# Patient Record
Sex: Male | Born: 1975 | Race: Black or African American | Hispanic: No | Marital: Single | Smoking: Current every day smoker
Health system: Southern US, Community
[De-identification: ages and names within clinical notes are randomized; demographics above are authoritative.]

## PROBLEM LIST (undated history)

## (undated) DIAGNOSIS — I10 Essential (primary) hypertension: Secondary | ICD-10-CM

---

## 2014-07-18 ENCOUNTER — Encounter (HOSPITAL_COMMUNITY): Payer: Self-pay | Admitting: Emergency Medicine

## 2014-07-18 ENCOUNTER — Emergency Department (HOSPITAL_COMMUNITY): Payer: Self-pay

## 2014-07-18 ENCOUNTER — Emergency Department (HOSPITAL_COMMUNITY)
Admission: EM | Admit: 2014-07-18 | Discharge: 2014-07-18 | Disposition: A | Discharge status: Home / Self Care | Payer: Self-pay | Attending: Emergency Medicine | Admitting: Emergency Medicine

## 2014-07-18 DIAGNOSIS — S62308A Unspecified fracture of other metacarpal bone, initial encounter for closed fracture: Secondary | ICD-10-CM

## 2014-07-18 DIAGNOSIS — Y9389 Activity, other specified: Secondary | ICD-10-CM | POA: Insufficient documentation

## 2014-07-18 DIAGNOSIS — Y998 Other external cause status: Secondary | ICD-10-CM | POA: Insufficient documentation

## 2014-07-18 DIAGNOSIS — Y9289 Other specified places as the place of occurrence of the external cause: Secondary | ICD-10-CM | POA: Insufficient documentation

## 2014-07-18 DIAGNOSIS — Z720 Tobacco use: Secondary | ICD-10-CM | POA: Insufficient documentation

## 2014-07-18 DIAGNOSIS — I10 Essential (primary) hypertension: Secondary | ICD-10-CM | POA: Insufficient documentation

## 2014-07-18 DIAGNOSIS — S62326A Displaced fracture of shaft of fifth metacarpal bone, right hand, initial encounter for closed fracture: Secondary | ICD-10-CM | POA: Insufficient documentation

## 2014-07-18 DIAGNOSIS — W2201XA Walked into wall, initial encounter: Secondary | ICD-10-CM | POA: Insufficient documentation

## 2014-07-18 HISTORY — DX: Essential (primary) hypertension: I10

## 2014-07-18 MED ORDER — OXYCODONE-ACETAMINOPHEN 5-325 MG PO TABS
1.0000 | ORAL_TABLET | Freq: Once | ORAL | Status: AC
  Administered 2014-07-18: 1 via ORAL
  Filled 2014-07-18: qty 1

## 2014-07-18 MED ORDER — OXYCODONE-ACETAMINOPHEN 5-325 MG PO TABS: 1.0000 | ORAL_TABLET | ORAL | Status: DC | PRN

## 2014-07-18 MED ORDER — ONDANSETRON 8 MG PO TBDP
8.0000 mg | ORAL_TABLET | Freq: Once | ORAL | Status: AC
  Administered 2014-07-18: 8 mg via ORAL
  Filled 2014-07-18: qty 1

## 2014-07-18 MED ORDER — NAPROXEN 250 MG PO TABS: 250.0000 mg | ORAL_TABLET | Freq: Two times a day (BID) | ORAL | Status: DC

## 2014-07-18 NOTE — ED Notes (Signed)
Pt's family walked out to desk and reported that the Pt has begun feeling nauseous.   Primary RN made aware.

## 2014-07-18 NOTE — ED Provider Notes (Signed)
CSN: [id number]     Arrival date & time 07/18/14  1548 History  This chart was scribed for non-physician practitioner, Matthew FarrierWilliam Trenton Verne, PA-C working with Matthew Munchobert Lockwood, MD, by Matthew Alvarado, ED Scribe. This patient was seen in room WTR5/WTR5 and the patient's care was started at 6:20 PM.     Chief Complaint  Patient presents with  . Hand Pain    The history is provided by the patient. No language interpreter was used.    HPI Comments: Matthew Alvarado is a 39 y.o. male with a h/o HTN who presents to the Emergency Department complaining of constant, mild, right hand pain for 1 day. He is having associated swelling to his right hand. Pt notes that he had numbness and tingling in his right fingertips yesterday but denies those symptoms today. He states he injured his hand while punching a wall yesterday. He denies any previous injuries to his hand. Patient reports that his pain was 10 out of 10 earlier but he was given a Percocet in the ER now he has no pain.  He denies any weakness, fever, or chills. He denies current numbness or tingling.    Past Medical History  Diagnosis Date  . Hypertension    History reviewed. No pertinent past surgical history. No family history on file. History  Substance Use Topics  . Smoking status: Current Some Day Smoker  . Smokeless tobacco: Not on file  . Alcohol Use: No    Review of Systems  Constitutional: Negative for fever and chills.  Musculoskeletal: Positive for joint swelling and arthralgias.  Skin: Negative for wound.  Neurological: Negative for weakness and numbness.      Allergies  Vicodin  Home Medications   Prior to Admission medications   Medication Sig Start Date End Date Taking? Authorizing Provider  naproxen (NAPROSYN) 250 MG tablet Take 1 tablet (250 mg total) by mouth 2 (two) times daily with a meal. 07/18/14   Matthew FarrierWilliam Rocklin Soderquist, PA-C  oxyCODONE-acetaminophen (PERCOCET/ROXICET) 5-325 MG per tablet Take 1 tablet by mouth every 4  (four) hours as needed for severe pain. For breakthrough pain 07/18/14   Matthew FarrierWilliam Matthew Spychalski, PA-C   Triage Vitals: BP 136/82 mmHg  Pulse 64  Temp(Src) 98.8 F (37.1 C) (Oral)  Resp 20  SpO2 100%  Physical Exam  Constitutional: He appears well-developed and well-nourished. No distress.  HENT:  Head: Normocephalic and atraumatic.  Eyes: Right eye exhibits no discharge. Left eye exhibits no discharge.  Cardiovascular: Intact distal pulses.   Bilateral radial pulses are intact. Good capillary refill in his distal digits.  Pulmonary/Chest: Effort normal. No respiratory distress.  Musculoskeletal: Normal range of motion. He exhibits edema and tenderness.       Right shoulder: He exhibits no tenderness.       Right elbow: No tenderness found.   bilateral radial pulses intact. Sensation intact bilaterally. Edema overlying dorsal aspect of right hand. No obvious deformity noted. Able to move all digits without difficulty. No right shoulder, wrist or elbow tenderness.  Neurological: He is alert. Coordination normal.  Skin: No rash noted. He is not diaphoretic.  Psychiatric: He has a normal mood and affect. His behavior is normal.  Nursing note and vitals reviewed.   ED Course  Procedures (including critical care time)  DIAGNOSTIC STUDIES: Oxygen Saturation is 100% on RA, normal by my interpretation.    COORDINATION OF CARE: 6:25 PM- Will order Gutter ulnar splint and discharge pt with Percocet.  Pt advised of plan for treatment and  pt agrees.    Labs Review Labs Reviewed - No data to display  Imaging Review Dg Hand Complete Right  07/18/2014   CLINICAL DATA:  The patient punched a wall 07/17/2014 with onset of right hand pain and swelling. Initial encounter.  EXAM: RIGHT HAND - COMPLETE 3+ VIEW  COMPARISON:  None.  FINDINGS: Soft tissue swelling is seen about the fifth metacarpal. There is a fracture through the proximal metaphysis of the fifth metacarpal which appears nondisplaced. No  other acute bony or joint abnormality is identified.  IMPRESSION: Acute Proximal metaphyseal fracture of the fifth metacarpal appears nondisplaced.   Electronically Signed   By: Matthew Alvarado M.D.   On: 07/18/2014 16:24     EKG Interpretation None      Filed Vitals:   07/18/14 1602  BP: 136/82  Pulse: 64  Temp: 98.8 F (37.1 C)  TempSrc: Oral  Resp: 20  SpO2: 100%     MDM   Meds given in ED:  Medications  oxyCODONE-acetaminophen (PERCOCET/ROXICET) 5-325 MG per tablet 1 tablet (1 tablet Oral Given 07/18/14 1737)  ondansetron (ZOFRAN-ODT) disintegrating tablet 8 mg (8 mg Oral Given 07/18/14 1737)    Discharge Medication List as of 07/18/2014  6:32 PM    START taking these medications   Details  naproxen (NAPROSYN) 250 MG tablet Take 1 tablet (250 mg total) by mouth 2 (two) times daily with a meal., Starting 07/18/2014, Until Discontinued, Print    oxyCODONE-acetaminophen (PERCOCET/ROXICET) 5-325 MG per tablet Take 1 tablet by mouth every 4 (four) hours as needed for severe pain. For breakthrough pain, Starting 07/18/2014, Until Discontinued, Print        Final diagnoses:  Closed fracture of 5th metacarpal, initial encounter   This is a 39 year old male presents to the emergency department complaining of right hand pain after punching a wall yesterday. The patient has edema overlying the dorsal aspect of his right hand without obvious deformity. Sensation is intact. He has good bilateral radial pulses. He has normal capillary refill. X-rays indicate a proximal non-displaced metaphyseal fracture of the fifth metacarpal. Will place an ulnar gutter splint and have him follow up with hand surgeon Matthew Alvarado. I advised him to call Matthew Alvarado office tomorrow to make an appointment for follow-up.  I advised the patient to follow-up with their primary care provider this week. I advised the patient to return to the emergency department with new or worsening symptoms or new concerns. The  patient verbalized understanding and agreement with plan.   I personally performed the services described in this documentation, which was scribed in my presence. The recorded information has been reviewed and is accurate.      Matthew Farrier, PA-C 07/18/14 1928  Matthew Munch, MD 07/19/14 [id number]

## 2014-07-18 NOTE — ED Notes (Signed)
Pt c/o pain and swelling in R hand after punching a wall yesterday. Pt has swelling to R lateral hand and sts it hurts to move his pinky finger. Pt denise numbness and tingling. A&Ox4 and ambulatory.

## 2014-07-18 NOTE — Discharge Instructions (Signed)
Fracture of the proximal aspect of your 5th metacarpal.  You have a break (fracture) of the fifth metacarpal bone. This is commonly called a boxer's fracture. This is the bone in the hand where the little finger attaches. The fracture is in the end of that bone, closest to the little finger. It is usually caused when you hit an object with a clenched fist. Often, the knuckle is pushed down by the impact. Sometimes, the fracture rotates out of position. A boxer's fracture will usually heal within 6 weeks, if it is treated properly and protected from re-injury. Surgery is sometimes needed. A cast, splint, or bulky hand dressing may be used to protect and immobilize a boxer's fracture. Do not remove this device or dressing until your caregiver approves. Keep your hand elevated, and apply ice packs for 15-20 minutes every 2 hours, for the first 2 days. Elevation and ice help reduce swelling and relieve pain. See your caregiver, or an orthopedic specialist, for follow-up care within the next 10 days. This is to make sure your fracture is healing properly. Document Released: 03/22/2005 Document Revised: 06/14/2011 Document Reviewed: 09/09/2006 [facility] Patient Information 2015 [place], [place]. This information is not intended to replace advice given to you by your health care provider. Make sure you discuss any questions you have with your health care provider.  Cast or Splint Care Casts and splints support injured limbs and keep bones from moving while they heal. It is important to care for your cast or splint at home.  HOME CARE INSTRUCTIONS  Keep the cast or splint uncovered during the drying period. It can take 24 to 48 hours to dry if it is made of plaster. A fiberglass cast will dry in less than 1 hour.  Do not rest the cast on anything harder than a pillow for the first 24 hours.  Do not put weight on your injured limb or apply pressure to the cast until your health care provider gives you  permission.  Keep the cast or splint dry. Wet casts or splints can lose their shape and may not support the limb as well. A wet cast that has lost its shape can also create harmful pressure on your skin when it dries. Also, wet skin can become infected.  Cover the cast or splint with a plastic bag when bathing or when out in the rain or snow. If the cast is on the trunk of the body, take sponge baths until the cast is removed.  If your cast does become wet, dry it with a towel or a blow dryer on the cool setting only.  Keep your cast or splint clean. Soiled casts may be wiped with a moistened cloth.  Do not place any hard or soft foreign objects under your cast or splint, such as cotton, toilet paper, lotion, or powder.  Do not try to scratch the skin under the cast with any object. The object could get stuck inside the cast. Also, scratching could lead to an infection. If itching is a problem, use a blow dryer on a cool setting to relieve discomfort.  Do not trim or cut your cast or remove padding from inside of it.  Exercise all joints next to the injury that are not immobilized by the cast or splint. For example, if you have a long leg cast, exercise the hip joint and toes. If you have an arm cast or splint, exercise the shoulder, elbow, thumb, and fingers.  Elevate your injured arm or  leg on 1 or 2 pillows for the first 1 to 3 days to decrease swelling and pain.It is best if you can comfortably elevate your cast so it is higher than your heart. SEEK MEDICAL CARE IF:   Your cast or splint cracks.  Your cast or splint is too tight or too loose.  You have unbearable itching inside the cast.  Your cast becomes wet or develops a soft spot or area.  You have a bad smell coming from inside your cast.  You get an object stuck under your cast.  Your skin around the cast becomes red or raw.  You have new pain or worsening pain after the cast has been applied. SEEK IMMEDIATE MEDICAL  CARE IF:   You have fluid leaking through the cast.  You are unable to move your fingers or toes.  You have discolored (blue or white), cool, painful, or very swollen fingers or toes beyond the cast.  You have tingling or numbness around the injured area.  You have severe pain or pressure under the cast.  You have any difficulty with your breathing or have shortness of breath.  You have chest pain. Document Released: 03/19/2000 Document Revised: 01/10/2013 Document Reviewed: 09/28/2012 [facility] Patient Information 2015 [place], [place]. This information is not intended to replace advice given to you by your health care provider. Make sure you discuss any questions you have with your health care provider.

## 2014-09-06 DIAGNOSIS — F1010 Alcohol abuse, uncomplicated: Secondary | ICD-10-CM | POA: Insufficient documentation

## 2014-09-06 DIAGNOSIS — F1410 Cocaine abuse, uncomplicated: Secondary | ICD-10-CM | POA: Insufficient documentation

## 2014-09-06 DIAGNOSIS — F1110 Opioid abuse, uncomplicated: Secondary | ICD-10-CM | POA: Insufficient documentation

## 2014-09-06 DIAGNOSIS — Z720 Tobacco use: Secondary | ICD-10-CM | POA: Insufficient documentation

## 2014-09-06 DIAGNOSIS — I10 Essential (primary) hypertension: Secondary | ICD-10-CM | POA: Insufficient documentation

## 2014-09-07 ENCOUNTER — Emergency Department (HOSPITAL_COMMUNITY)
Admission: EM | Admit: 2014-09-07 | Discharge: 2014-09-07 | Disposition: A | Discharge status: Home / Self Care | Payer: Self-pay | Attending: Emergency Medicine | Admitting: Emergency Medicine

## 2014-09-07 ENCOUNTER — Encounter (HOSPITAL_COMMUNITY): Payer: Self-pay | Admitting: Emergency Medicine

## 2014-09-07 DIAGNOSIS — F1910 Other psychoactive substance abuse, uncomplicated: Secondary | ICD-10-CM

## 2014-09-07 MED ORDER — LOPERAMIDE HCL 2 MG PO CAPS: ORAL_CAPSULE | ORAL | Status: DC

## 2014-09-07 MED ORDER — METHOCARBAMOL 500 MG PO TABS: 500.0000 mg | ORAL_TABLET | Freq: Three times a day (TID) | ORAL | Status: DC | PRN

## 2014-09-07 MED ORDER — DICYCLOMINE HCL 20 MG PO TABS: 20.0000 mg | ORAL_TABLET | Freq: Four times a day (QID) | ORAL | Status: AC | PRN

## 2014-09-07 MED ORDER — NAPROXEN 500 MG PO TABS: 500.0000 mg | ORAL_TABLET | Freq: Two times a day (BID) | ORAL | Status: AC

## 2014-09-07 MED ORDER — HYDROXYZINE HCL 25 MG PO TABS: 25.0000 mg | ORAL_TABLET | Freq: Four times a day (QID) | ORAL | Status: AC | PRN

## 2014-09-07 NOTE — ED Notes (Addendum)
Pt from home requesting detox from alcohol and drugs. He reports last use of cocaine. Heroin, and alcohol was at 1900. He denies SI or HI. He reports he is depressed "but I don't want to hurt myself or anybody".

## 2014-09-07 NOTE — Discharge Instructions (Signed)
Polysubstance Abuse When people abuse more than one drug or type of drug it is called polysubstance or polydrug abuse. For example, many smokers also drink alcohol. This is one form of polydrug abuse. Polydrug abuse also refers to the use of a drug to counteract an unpleasant effect produced by another drug. It may also be used to help with withdrawal from another drug. People who take stimulants may become agitated. Sometimes this agitation is countered with a tranquilizer. This helps protect against the unpleasant side effects. Polydrug abuse also refers to the use of different drugs at the same time.  Anytime drug use is interfering with normal living activities, it has become abuse. This includes problems with family and friends. Psychological dependence has developed when your mind tells you that the drug is needed. This is usually followed by physical dependence which has developed when continuing increases of drug are required to get the same feeling or "high". This is known as addiction or chemical dependency. A person's risk is much higher if there is a history of chemical dependency in the family. SIGNS OF CHEMICAL DEPENDENCY  You have been told by friends or family that drugs have become a problem.  You fight when using drugs.  You are having blackouts (not remembering what you do while using).  You feel sick from using drugs but continue using.  You lie about use or amounts of drugs (chemicals) used.  You need chemicals to get you going.  You are suffering in work performance or in school because of drug use.  You get sick from use of drugs but continue to use anyway.  You need drugs to relate to people or feel comfortable in social situations.  You use drugs to forget problems. "Yes" answered to any of the above signs of chemical dependency indicates there are problems. The longer the use of drugs continues, the greater the problems will become. If there is a family history of  drug or alcohol use, it is best not to experiment with these drugs. Continual use leads to tolerance. After tolerance develops more of the drug is needed to get the same feeling. This is followed by addiction. With addiction, drugs become the most important part of life. It becomes more important to take drugs than participate in the other usual activities of life. This includes relating to friends and family. Addiction is followed by dependency. Dependency is a condition where drugs are now needed not just to get high, but to feel normal. Addiction cannot be cured but it can be stopped. This often requires outside help and the care of professionals. Treatment centers are listed in the yellow pages under: Cocaine, Narcotics, and Alcoholics Anonymous. Most hospitals and clinics can refer you to a specialized care center. Talk to your caregiver if you need help. Document Released: 11/11/2004 Document Revised: 06/14/2011 Document Reviewed: 03/22/2005 [facility] Patient Information 2015 [place], [place]. This information is not intended to replace advice given to you by your health care provider. Make sure you discuss any questions you have with your health care provider.  Behavioral Health Resources in the [facility]  Intensive Outpatient Programs: [facility]      [address] [place], [place] [phone] Both a day and evening program       [facility]     [address]        [place], [place] [address] [phone]  [facility] [address] [place] [phone]  [facility] Mental Health ACCESS LINE: [phone] or [phone] [address] [place], [place] [address] [email or web address]  Mobile Crisis Teams:                                        [facility]         Mobile Crisis Care Unit [phone]             [facility] [address] Ginette Otto [phone]                                         [facility] [address] [place] [phone]  Self-Help/Support Groups: Mental Health [facility]oc[facility] of [employer] of support groups [phone] (call for more info)  Narcotics Anonymous (NA) [facility] [address] [place] - 2 meetings at this location  Residential Treatment Programs:  [facility]      [address]        [place]       [phone]         [facility] [address], [place] [address] [place], [place]  [address] [phone]  [facility]  [address] [place], [place] [address] [phone] Admissions: 8am-3pm M-F  [facility]     [address]        [place], [place] [address]       [phone]         [facility] [address] [place], [place] [phone]  [facility] [address] [place], [place] [phone]  [facility] - Intensive Outpatient      [address]     [place], [place]       [phone]         [facility]     [address] [place], [place] [phone] or [phone]  [facility] ([facility])  [address] [place], [place] [phone]  [facility] [address]                                               [address] [place] [phone]  [facility] Resources: [phone]               General Therapy                                                Angie Fava, PhD        [address] [place], [place] [address]         [phone]   Insurance  [facility]   [address] [place], [place] [address]  [phone]  [facility] [address] [place], [place] [address] [phone] Insurance/Medicaid/sponsorship  through [facility]                                              [address]                                        [place], [place] [address]    Therapy/tele-psych/case         [phone]          [facility] [address], [place]  [address]  Adolescent/group home/case management [phone]                                           Creola Corn PhD       General therapy       Insurance   [phone]         Dr. Lolly Mustache Insurance [phone] M-F  [facility], sponsorship [phone]

## 2014-09-07 NOTE — ED Provider Notes (Signed)
CSN: [id number]     Arrival date & time 09/06/14  2303 History   First MD Initiated Contact with Patient 09/07/14 [phone]     Chief Complaint  Patient presents with  . detox      (Consider location/radiation/quality/duration/timing/severity/associated sxs/prior Treatment) HPI [age] year old male presents to the emergency department from home with complaint of polysubstance abuse, requesting detox.  Patient reports that he abuses alcohol, cocaine, heroin.  Patient reports he drinks every few days, usually vodka.  He does not get shaky or have withdrawal symptoms if he goes a day or 2 without drinking.  Patient reports that he is recently escalating with his heroin use, now is using almost daily.  He reports he uses cocaine frequently as well.  Patient denies any significant withdrawal symptoms if he goes a day without heroin.  He does report some diarrhea.  Patient reports some depression.  He denies any suicidal ideation.  Last use this evening. Past Medical History  Diagnosis Date  . Hypertension    History reviewed. No pertinent past surgical history. No family history on file. History  Substance Use Topics  . Smoking status: Current Some Day Smoker  . Smokeless tobacco: Not on file  . Alcohol Use: No    Review of Systems  Psychiatric/Behavioral: Negative for suicidal ideas and self-injury.  All other systems reviewed and are negative.     Allergies  Vicodin  Home Medications   Prior to Admission medications   Medication Sig Start Date End Date Taking? Authorizing Provider  ibuprofen (ADVIL,MOTRIN) 200 MG tablet Take 400 mg by mouth every 6 (six) hours as needed for moderate pain.   Yes Historical Provider, MD  dicyclomine (BENTYL) 20 MG tablet Take 1 tablet (20 mg total) by mouth every 6 (six) hours as needed for spasms (for abdominal cramping). 09/07/14   Marisa Severinlga Chandan Fly, MD  hydrOXYzine (ATARAX/VISTARIL) 25 MG tablet Take 1 tablet (25 mg total) by mouth every 6 (six) hours as needed  for anxiety or nausea. 09/07/14   Marisa Severinlga Nazly Digilio, MD  loperamide (IMODIUM) 2 MG capsule Take 1-2 capsules (2-4 mg total) by mouth as needed for diarrhea 09/07/14   Marisa Severinlga Alvine Mostafa, MD  methocarbamol (ROBAXIN) 500 MG tablet Take 1 tablet (500 mg total) by mouth every 8 (eight) hours as needed for muscle spasms. 09/07/14   Marisa Severinlga Lillianne Eick, MD  naproxen (NAPROSYN) 500 MG tablet Take 1 tablet (500 mg total) by mouth 2 (two) times daily with a meal. 09/07/14   Marisa Severinlga Nehemiah Montee, MD   BP 136/88 mmHg  Pulse 68  Temp(Src) 98 F (36.7 C) (Oral)  Resp 19  SpO2 100% Physical Exam  Constitutional: He is oriented to person, place, and time. He appears well-developed and well-nourished.  HENT:  Head: Normocephalic and atraumatic.  Nose: Nose normal.  Mouth/Throat: Oropharynx is clear and moist.  Eyes: Conjunctivae and EOM are normal. Pupils are equal, round, and reactive to light.  Neck: Normal range of motion. Neck supple. No JVD present. No tracheal deviation present. No thyromegaly present.  Cardiovascular: Normal rate, regular rhythm, normal heart sounds and intact distal pulses.  Exam reveals no gallop and no friction rub.   No murmur heard. Pulmonary/Chest: Effort normal and breath sounds normal. No stridor. No respiratory distress. He has no wheezes. He has no rales. He exhibits no tenderness.  Abdominal: Soft. Bowel sounds are normal. He exhibits no distension and no mass. There is no tenderness. There is no rebound and no guarding.  Musculoskeletal: Normal range of motion.  He exhibits no edema or tenderness.  Lymphadenopathy:    He has no cervical adenopathy.  Neurological: He is alert and oriented to person, place, and time. He displays normal reflexes. He exhibits normal muscle tone. Coordination normal.  Skin: Skin is warm and dry. No rash noted. No erythema. No pallor.  Psychiatric: He has a normal mood and affect. His behavior is normal. Judgment and thought content normal.  Nursing note and vitals reviewed.   ED  Course  Procedures (including critical care time) Labs Review Labs Reviewed - No data to display  Imaging Review No results found.   EKG Interpretation None      MDM   Final diagnoses:  Polysubstance abuse    39 year old male with polysubstance abuse.  Patient by his own history, does not appear to have any withdrawal symptoms at this time or at any specific kind if he goes long without abusing heroin or alcohol.  Patient is requesting inpatient treatment, he reports this is because if he goes back to where he is living.  He will abuse drugs again.  I have explained to the patient that we no longer provide inpatient treatment for alcohol or opiate abuse.  I have offered him medications to help with withdrawal symptoms, and outpatient resources.  Patient told the nurse briefly that he was starting to feel suicidal, wanted talk to him about this, he denies that he has a plan for killing himself, and reports that he wouldn't try to commit suicide.  He is only depressed about having to go back into the environment that caused him to abuse drugs in the first place.  Patient reports that he will use the outpatient resources given.    Marisa Severin, MD 09/07/14 [phone]

## 2014-10-10 ENCOUNTER — Emergency Department (HOSPITAL_COMMUNITY): Payer: Self-pay

## 2014-10-10 ENCOUNTER — Emergency Department (HOSPITAL_COMMUNITY)
Admission: EM | Admit: 2014-10-10 | Discharge: 2014-10-10 | Disposition: A | Discharge status: Home / Self Care | Payer: Self-pay | Attending: Emergency Medicine | Admitting: Emergency Medicine

## 2014-10-10 ENCOUNTER — Encounter (HOSPITAL_COMMUNITY): Payer: Self-pay

## 2014-10-10 DIAGNOSIS — I10 Essential (primary) hypertension: Secondary | ICD-10-CM | POA: Insufficient documentation

## 2014-10-10 DIAGNOSIS — Y9289 Other specified places as the place of occurrence of the external cause: Secondary | ICD-10-CM | POA: Insufficient documentation

## 2014-10-10 DIAGNOSIS — R42 Dizziness and giddiness: Secondary | ICD-10-CM | POA: Insufficient documentation

## 2014-10-10 DIAGNOSIS — S52122A Displaced fracture of head of left radius, initial encounter for closed fracture: Secondary | ICD-10-CM | POA: Insufficient documentation

## 2014-10-10 DIAGNOSIS — W11XXXA Fall on and from ladder, initial encounter: Secondary | ICD-10-CM | POA: Insufficient documentation

## 2014-10-10 DIAGNOSIS — Y9339 Activity, other involving climbing, rappelling and jumping off: Secondary | ICD-10-CM | POA: Insufficient documentation

## 2014-10-10 DIAGNOSIS — Y990 Civilian activity done for income or pay: Secondary | ICD-10-CM | POA: Insufficient documentation

## 2014-10-10 DIAGNOSIS — W19XXXA Unspecified fall, initial encounter: Secondary | ICD-10-CM

## 2014-10-10 DIAGNOSIS — Z720 Tobacco use: Secondary | ICD-10-CM | POA: Insufficient documentation

## 2014-10-10 MED ORDER — OXYCODONE-ACETAMINOPHEN 5-325 MG PO TABS: 1.0000 | ORAL_TABLET | Freq: Three times a day (TID) | ORAL | Status: DC | PRN

## 2014-10-10 MED ORDER — OXYCODONE-ACETAMINOPHEN 5-325 MG PO TABS
1.0000 | ORAL_TABLET | Freq: Once | ORAL | Status: AC
  Administered 2014-10-10: 1 via ORAL
  Filled 2014-10-10: qty 1

## 2014-10-10 NOTE — ED Notes (Signed)
Pt presents with c/o left elbow injury after a fall that occurred earlier today. Pt reports he caught himself on that left arm when he fell.

## 2014-10-10 NOTE — ED Provider Notes (Signed)
CSN: [id number]     Arrival date & time 10/10/14  1747 History  This chart was scribed for Matthew Helper, PA-C, working with Arby Barrette, MD by Chestine Spore, ED Scribe. The patient was seen in room WTR5/WTR5 at 7:01 PM.        Chief Complaint  Patient presents with  . Arm Injury     The history is provided by the patient. No language interpreter was used.    HPI Comments: Matthew Alvarado is a 39 y.o. male with a medical hx of HTN who presents to the Emergency Department complaining of a left arm injury onset today 2:00 PM. Pt reports that he fell from 3 feet while trying to climb on a 4 feet ladder while working and caught himself on his left arm when he fell. Pt is right hand dominant, pt rates his left arm pain as 10/10. He states that he is having associated symptoms of left arm pain and lightheaded. He denies LOC, and any other symptoms. Pt is allergic to vicodin.   Past Medical History  Diagnosis Date  . Hypertension    History reviewed. No pertinent past surgical history. No family history on file. History  Substance Use Topics  . Smoking status: Current Some Day Smoker  . Smokeless tobacco: Not on file  . Alcohol Use: No    Review of Systems  Musculoskeletal: Positive for arthralgias. Negative for joint swelling.  Skin: Negative for color change and wound.  Neurological: Positive for light-headedness. Negative for syncope.      Allergies  Vicodin  Home Medications   Prior to Admission medications   Medication Sig Start Date End Date Taking? Authorizing Provider  ibuprofen (ADVIL,MOTRIN) 200 MG tablet Take 400 mg by mouth every 6 (six) hours as needed for moderate pain.   Yes Historical Provider, MD  dicyclomine (BENTYL) 20 MG tablet Take 1 tablet (20 mg total) by mouth every 6 (six) hours as needed for spasms (for abdominal cramping). Patient not taking: Reported on 10/10/2014 09/07/14   Marisa Severin, MD  hydrOXYzine (ATARAX/VISTARIL) 25 MG tablet Take 1 tablet (25 mg  total) by mouth every 6 (six) hours as needed for anxiety or nausea. Patient not taking: Reported on 10/10/2014 09/07/14   Marisa Severin, MD  loperamide (IMODIUM) 2 MG capsule Take 1-2 capsules (2-4 mg total) by mouth as needed for diarrhea Patient not taking: Reported on 10/10/2014 09/07/14   Marisa Severin, MD  methocarbamol (ROBAXIN) 500 MG tablet Take 1 tablet (500 mg total) by mouth every 8 (eight) hours as needed for muscle spasms. Patient not taking: Reported on 10/10/2014 09/07/14   Marisa Severin, MD  naproxen (NAPROSYN) 500 MG tablet Take 1 tablet (500 mg total) by mouth 2 (two) times daily with a meal. Patient not taking: Reported on 10/10/2014 09/07/14   Marisa Severin, MD   BP 139/80 mmHg  Pulse 84  Temp(Src) 99 F (37.2 C) (Oral)  Resp 20  SpO2 98% Physical Exam  Constitutional: He is oriented to person, place, and time. He appears well-developed and well-nourished. No distress.  HENT:  Head: Normocephalic and atraumatic.  Eyes: EOM are normal.  Neck: Neck supple. No tracheal deviation present.  Cardiovascular: Normal rate.   Pulmonary/Chest: Effort normal. No respiratory distress.  Musculoskeletal:       Left elbow: He exhibits decreased range of motion (secondary to pain) and swelling. Tenderness found.  Left elbow tenderness noted to posterior elbow at the posterior epicondyle with mild edema. Decreased flexion and extension  secondary to pain. No crepitus.  L elbow with FROM and nontender L wrist with FROM and nontender L hand without tenderness, no pain at anatomical snuff box.  Radial pulse intact in L wrist.   Neurological: He is alert and oriented to person, place, and time.  Skin: Skin is warm and dry.  Psychiatric: He has a normal mood and affect. His behavior is normal.  Nursing note and vitals reviewed.   ED Course  Procedures (including critical care time) DIAGNOSTIC STUDIES: Oxygen Saturation is 98% on RA, nl by my interpretation.    COORDINATION OF CARE: 7:05 PM-patient here  with mechanical injury to left elbow. He is neurovascularly intact. Discussed treatment plan which includes left elbow x-ray, percocet with pt at bedside and pt agreed to plan.   7:46 PM X-ray of left elbow shows a large elbow joint effusion which raises suspicion for a poorly characterized radial head fracture. I suspect this is indeed a radial head fracture, closed. Plan to apply a posterior long arm with sling. Patient discharged with pain medication and close follow-up with an specialist further care. Care discussed with Dr. Donnald GarrePfeiffer.   Labs Review Labs Reviewed - No data to display  Imaging Review Dg Elbow Complete Left  10/10/2014   CLINICAL DATA:  Status post fall off ladder; left posterior elbow pain. Initial encounter.  EXAM: LEFT ELBOW - COMPLETE 3+ VIEW  COMPARISON:  None.  FINDINGS: A large elbow joint effusion is noted. This raises suspicion for a poorly characterized radial head fracture. No additional fractures are seen. The soft tissues are otherwise grossly unremarkable.  IMPRESSION: Large elbow joint effusion raises suspicion for a poorly characterized radial head fracture.   Electronically Signed   By: Roanna RaiderJeffery  Chang M.D.   On: 10/10/2014 19:29     EKG Interpretation None      MDM   Final diagnoses:  Fall  Left radial head fracture, closed, initial encounter    BP 139/80 mmHg  Pulse 84  Temp(Src) 99 F (37.2 C) (Oral)  Resp 20  SpO2 98%  I have reviewed nursing notes and vital signs. I personally viewed the imaging tests through PACS system and agrees with radiologist's intepretation I reviewed available ER/hospitalization records through the EMR   I personally performed the services described in this documentation, which was scribed in my presence. The recorded information has been reviewed and is accurate.     Matthew HelperBowie Eligah Anello, PA-C 10/10/14 1947  Arby BarretteMarcy Pfeiffer, MD 10/14/14 [phone]

## 2014-10-10 NOTE — Discharge Instructions (Signed)
Follow up with [name] next week for further management of your left elbow injury.  Follow instruction below.   Elbow Fracture, Radial Head with Rehab The radial head is the end of the forearm bone on the thumb side of the arm (radius). It is part of the elbow. The radial head is vulnerable to both complete and incomplete fractures. SYMPTOMS   Severe elbow and arm pain, at the time of injury.  Tenderness, inflammation, and later bruising (contusion) of the elbow (within 48 hours).  Visible deformity, if the fracture is complete, and the bone fragments are not aligned properly (displaced).  Numbness, coldness, or paralysis in the elbow, forearm, or hand from pressure on the blood vessels or nerves (uncommon). CAUSES  An elbow fracture occurs when a force is placed on the bone that is greater than it can handle. Typical causes of injury include:  Indirect trauma, such as falling on an outstretched hand (most common cause).  Direct hit (trauma) to the elbow.  Twisting injury to the elbow. RISK INCREASES WITH:  Contact sports (football, rugby).  Sports in which falling is likely (skating, basketball).  Children under 51 years of age and adults over 33.  Bone or joint disease (osteoarthritis, bone tumor).  Poor strength and flexibility. PREVENTION   Warm up and stretch properly before activity.  Maintain physical fitness:  Strength, flexibility, and endurance.  Cardiovascular fitness.  When appropriate, wear properly fitted and padded elbow protection. PROGNOSIS  If treated properly, elbow fractures often heal within 6 to 8 weeks for adults, and 4 to 6 weeks in children.  RELATED COMPLICATIONS   Fracture does not heal (nonunion).  Fracture heals in improper alignment (malunion).  Chronic pain, stiffness, loss of motion, or swelling of the elbow.  Excessive bleeding in the elbow or at the fracture site, causing pressure and injury to nerves and blood vessels  (uncommon).  Calcification of the soft tissues around the elbow (heterotopic ossification).  Risk of bone death, due to interrupted blood supply caused by the fracture.  Unstable or arthritic joint, following repeated injury.  Stopping of normal bone growth in children.  Wasting away (atrophy), weakness, stiffness, numbness, and poor control of the hand, due to injury to blood vessels, nerves, cartilage, muscle, ligaments, and connective tissue sheets (fascia). TREATMENT  If the fracture is displaced, it must be put back in proper alignment (reduced) by an individual trained in the procedure. Often, displaced fractures cannot be realigned by hand, and surgery is needed. Once the bones are aligned (with or without surgery), ice and medicine can be used to reduce pain and inflammation. The elbow should be restrained for at least 1 to 2 weeks. After restraint, it is important to complete strengthening and stretching exercises, to regain strength and a full range of motion. Theses exercises may be completed at home or with a therapist.  MEDICATION   If pain medicine is needed, nonsteroidal anti-inflammatory medicines (aspirin and ibuprofen), or other minor pain relievers (acetaminophen), are often advised.  Do not take pain medicine for 7 days before surgery.  Prescription pain relievers may be given, if your caregiver thinks they are needed. Use only as directed and only as much as you need. COLD THERAPY  Cold treatment (icing) should be applied for 10 to 15 minutes every 2 to 3 hours for inflammation and pain, and immediately after activity that aggravates your symptoms. Use ice packs or an ice massage. SEEK MEDICAL CARE IF:  Pain, tenderness, or swelling gets worse,  despite treatment.  You experience pain, numbness, or coldness in the hand.  Blue, gray, or dark color appears in the fingernails.  Any of the following occur after surgery: fever, increased pain, swelling, redness, drainage  of fluids, or bleeding in the affected area.  New, unexplained symptoms develop. (Drugs used in treatment may produce side effects.) EXERCISES  RANGE OF MOTION (ROM) AND STRETCHING EXERCISES - Elbow Fracture (Radial Head) These exercises may help you restore your elbow mobility once your physician has discontinued your restraint period. Beginning these before your caregiver's approval may result in delayed healing. Your symptoms may go away with or without further involvement from your physician, physical therapist or athletic trainer. While completing these exercises, remember:   Restoring tissue flexibility helps normal motion to return to the joints. This allows healthier, less painful movement and activity.  An effective stretch should be held for at least 30 seconds. A stretch should never be painful. You should only feel a gentle lengthening or release in the stretch. RANGE OF MOTION - Supination, Active-Assisted  Sit with your right / left elbow bent at 90 degrees, resting your forearm on a table.  Keeping your upper body and shoulder in place, roll your forearm so your palm faces upward. When you can go no farther, use your opposite hand to help, until you feel a gentle to moderate stretch. Hold for __________ seconds.  Slowly release the stretch and return to the starting position. Repeat __________ times. Complete this exercise __________ times per day. RANGE OF MOTION - Pronation, Active-Assisted   Sit with your right / left elbow bent at 90 degrees, resting your forearm on a table.  Keeping your upper body and shoulder in place, roll your forearm so your palm faces the tabletop. When you can go no farther, use your opposite hand to help, until you feel a gentle to moderate stretch. Hold for __________ seconds.  Slowly release the stretch and return to the starting position. Repeat __________ times. Complete this exercise __________ times per day. RANGE OF MOTION -  Extension  Hold your right / left arm at your side and straighten your elbow as far as you can, using your right / left arm muscles.  Straighten the right / left elbow farther by gently pushing down on your forearm, until you feel a gentle stretch on the inside of your elbow. Hold this position for __________ seconds.  Slowly return to the starting position. Repeat __________ times. Complete this exercise __________ times per day.  RANGE OF MOTION - Flexion  Hold your right / left arm at your side and bend your elbow as far as you can, using your right / left arm muscles.  Bend the right / left elbow farther by gently pushing up on your forearm, until you feel a gentle stretch on the outside of your elbow. Hold this position for __________ seconds.  Slowly return to the starting position. Repeat __________ times. Complete this exercise __________ times per day.  RANGE OF MOTION - Supination, Active  Stand or sit with your elbows at your side. Bend your right / left elbow to 90 degrees.  Turn your palm upward until you feel a gentle stretch on the inside of your forearm.  Hold this position for __________ seconds. Slowly release and return to the starting position. Repeat __________ times. Complete this stretch __________ times per day.  RANGE OF MOTION - Pronation, Active  Stand or sit with your elbows at your side. Bend your right /  left elbow to 90 degrees.  Turn your palm downward until you feel a gentle stretch on the top of your forearm.  Hold this position for __________ seconds. Slowly release and return to the starting position. Repeat __________ times. Complete this stretch __________ times per day.  RANGE OF MOTION - Elbow Flexion, Supine  Lie on your back. Extend your right / left arm into the air, bracing it with your opposite hand. Allow your right / left arm to relax.  Let your elbow bend, allowing your hand to fall slowly toward your chest.  You should feel a  gentle stretch along the back of your upper arm and elbow. Your physician, physical therapist or athletic trainer may ask you to hold a __________ hand weight, to increase the intensity of this stretch.  Hold for __________ seconds. Slowly return your right / left arm to the upright position. Repeat __________ times. Complete this exercise __________ times per day. STRETCH - Elbow flexors   Lie on a firm bed or countertop, on your back. Be sure that you are in a comfortable position which will allow you to relax your arm muscles.  Place a folded towel under your right / left upper arm, so that your elbow and shoulder are at the same height. Extend your arm; your elbow should not rest on the bed or towel.  Allow the weight of your hand to straighten your elbow. Keep your arm and chest muscles relaxed. Your caregiver may ask you to increase the intensity of your stretch by adding a small wrist or hand weight.  Hold for __________ seconds. You should feel a stretch on the inside of your elbow. Slowly return to the starting position. Repeat __________ times. Complete this exercise __________ times per day. STRENGTHENING EXERCISES - Elbow Fracture (Radial Head) These exercises may help you restore your elbow mobility once your physician has discontinued your restraint period. Beginning these before your caregiver's approval may result in delayed healing. Your symptoms may go away with or without further involvement from your physician, physical therapist or athletic trainer. While completing these exercises, remember:   Restoring tissue flexibility helps normal motion to return to the joints. This allows healthier, less painful movement and activity.  An effective stretch should be held for at least 30 seconds. A stretch should never be painful. You should only feel a gentle lengthening or release in the stretch.  You may experience muscle soreness or fatigue, but the pain or discomfort you are  trying to eliminate should never worsen during these exercises. If this pain does get worse, stop and make sure you are following the directions exactly. If the pain is still present after adjustments, discontinue the exercise until you can discuss the trouble with your caregiver. STRENGTH - Elbow Flexors, Isometric  Stand or sit upright, on a firm surface. Place your right / left arm so that your hand is palm-up, and at the height of your waist.  Place your opposite hand on top of your forearm. Gently push down as your right / left arm resists. Push as hard as you can with both arms, without causing any pain or movement at your right / left elbow. Hold this stationary position for __________ seconds.  Gradually release the tension in both arms. Allow your muscles to relax completely before repeating. Repeat __________ times. Complete this exercise __________ times per day. STRENGTH - Elbow Extensors, Isometric  Stand or sit upright, on a firm surface. Place your right / left arm  so that your palm faces your stomach, and it is at the height of your waist.  Place your opposite hand on the underside of your forearm. Gently push up as your right / left arm resists. Push as hard as you can with both arms, without causing any pain or movement at your right / left elbow. Hold this stationary position for __________ seconds.  Gradually release the tension in both arms. Allow your muscles to relax completely before repeating. Repeat __________ times. Complete this exercise __________ times per day. STRENGTH - Elbow Flexors, Supinated  With good posture, stand, or sit on a firm chair without armrests. Allow your right / left arm to rest at your side with your palm facing forward.  Holding a __________ weight, or gripping a rubber exercise band or tubing, bring your hand toward your shoulder.  Allow your muscles to control the resistance, as your hand returns to your side. Repeat __________ times.  Complete this exercise __________ times per day.  STRENGTH - Elbow Extensors, Dynamic  With good posture, stand, or sit on a firm chair without armrests. Keeping your upper arms at your side, bring both hands up toward your right / left shoulder, while gripping a rubber exercise band or tubing. Your right / left hand should be just below the other hand.  Straighten your right / left elbow. Hold for __________ seconds.  Allow your muscles to control the rubber exercise band, as your hand returns to your shoulder. Repeat __________ times. Complete this exercise __________ times per day. STRENGTH - Forearm Supinators   Sit with your right / left forearm supported on a table, keeping your elbow below shoulder height. Rest your hand over the edge, palm down.  Gently grip a hammer or a soup ladle.  Without moving your elbow, slowly turn your palm and hand upward to a "thumbs-up" position.  Hold this position for __________ seconds. Slowly return to the starting position. Repeat __________ times. Complete this exercise __________ times per day.  STRENGTH - Forearm Pronators  Sit with your right / left forearm supported on a table, keeping your elbow below shoulder height. Rest your hand over the edge, palm up.  Gently grip a hammer or a soup ladle.  Without moving your elbow, slowly turn your palm and hand upward to a "thumbs-up" position.  Hold this position for __________ seconds. Slowly return to the starting position. Repeat __________ times. Complete this exercise __________ times per day.  Document Released: 03/22/2005 Document Revised: 06/14/2011 Document Reviewed: 07/04/2008 [facility] Patient Information 2015 [place], [place]. This information is not intended to replace advice given to you by your health care provider. Make sure you discuss any questions you have with your health care provider.

## 2016-08-14 ENCOUNTER — Encounter (HOSPITAL_COMMUNITY): Payer: Self-pay | Admitting: Emergency Medicine

## 2016-08-14 ENCOUNTER — Emergency Department (HOSPITAL_COMMUNITY)
Admission: EM | Admit: 2016-08-14 | Discharge: 2016-08-15 | Disposition: A | Discharge status: Home / Self Care | Payer: Self-pay | Attending: Emergency Medicine | Admitting: Emergency Medicine

## 2016-08-14 DIAGNOSIS — I10 Essential (primary) hypertension: Secondary | ICD-10-CM | POA: Insufficient documentation

## 2016-08-14 DIAGNOSIS — F329 Major depressive disorder, single episode, unspecified: Secondary | ICD-10-CM | POA: Insufficient documentation

## 2016-08-14 DIAGNOSIS — F17210 Nicotine dependence, cigarettes, uncomplicated: Secondary | ICD-10-CM | POA: Insufficient documentation

## 2016-08-14 DIAGNOSIS — F4329 Adjustment disorder with other symptoms: Secondary | ICD-10-CM

## 2016-08-14 DIAGNOSIS — Z79899 Other long term (current) drug therapy: Secondary | ICD-10-CM | POA: Insufficient documentation

## 2016-08-14 DIAGNOSIS — R45851 Suicidal ideations: Secondary | ICD-10-CM

## 2016-08-14 LAB — COMPREHENSIVE METABOLIC PANEL
ALT: 12 U/L — ABNORMAL LOW (ref 17–63)
AST: 32 U/L (ref 15–41)
Albumin: 4.3 g/dL (ref 3.5–5.0)
Alkaline Phosphatase: 64 U/L (ref 38–126)
Anion gap: 7 (ref 5–15)
BUN: 12 mg/dL (ref 6–20)
CO2: 24 mmol/L (ref 22–32)
Calcium: 9.2 mg/dL (ref 8.9–10.3)
Chloride: 107 mmol/L (ref 101–111)
Creatinine, Ser: 1.44 mg/dL — ABNORMAL HIGH (ref 0.61–1.24)
GFR calc Af Amer: >60 mL/min (ref >60)
GFR calc non Af Amer: 60 mL/min — ABNORMAL LOW (ref >60)
Glucose, Bld: 108 mg/dL — ABNORMAL HIGH (ref 65–99)
Potassium: 3.8 mmol/L (ref 3.5–5.1)
Sodium: 138 mmol/L (ref 135–145)
Total Bilirubin: 0.9 mg/dL (ref 0.3–1.2)
Total Protein: 7.7 g/dL (ref 6.5–8.1)

## 2016-08-14 LAB — RAPID URINE DRUG SCREEN, HOSP PERFORMED
Amphetamines: NOT DETECTED
Barbiturates: NOT DETECTED
Benzodiazepines: NOT DETECTED
Cocaine: POSITIVE — AB
Opiates: NOT DETECTED
Tetrahydrocannabinol: POSITIVE — AB

## 2016-08-14 LAB — CBC WITH DIFFERENTIAL/PLATELET
Basophils Absolute: 0 10*3/uL (ref 0.0–0.1)
Basophils Relative: 0 %
Eosinophils Absolute: 0.2 10*3/uL (ref 0.0–0.7)
Eosinophils Relative: 3 %
HCT: 42.9 % (ref 39.0–52.0)
Hemoglobin: 14.6 g/dL (ref 13.0–17.0)
Lymphocytes Relative: 37 %
Lymphs Abs: 3.6 10*3/uL (ref 0.7–4.0)
MCH: 30.4 pg (ref 26.0–34.0)
MCHC: 34 g/dL (ref 30.0–36.0)
MCV: 89.2 fL (ref 78.0–100.0)
Monocytes Absolute: 0.6 10*3/uL (ref 0.1–1.0)
Monocytes Relative: 6 %
Neutro Abs: 5.2 10*3/uL (ref 1.7–7.7)
Neutrophils Relative %: 54 %
Platelets: 234 10*3/uL (ref 150–400)
RBC: 4.81 MIL/uL (ref 4.22–5.81)
RDW: 13.1 % (ref 11.5–15.5)
WBC: 9.6 10*3/uL (ref 4.0–10.5)

## 2016-08-14 LAB — ETHANOL: Alcohol, Ethyl (B): <5 mg/dL (ref <5)

## 2016-08-14 MED ORDER — IBUPROFEN 200 MG PO TABS: 400.0000 mg | ORAL_TABLET | Freq: Four times a day (QID) | ORAL | Status: DC | PRN

## 2016-08-14 NOTE — ED Notes (Signed)
Bed: [facility] Expected date:  Expected time:  Means of arrival:  Comments: No bed. 

## 2016-08-14 NOTE — ED Notes (Signed)
[facility] Expected date:  Expected time:  Means of arrival:  Comments: TRIAGE 9

## 2016-08-14 NOTE — ED Provider Notes (Signed)
[facility]-EMERGENCY DEPT Provider Note   CSN: [id number] Arrival date & time: 08/14/16  1143   By signing my name below, I, Matthew Alvarado, attest that this documentation has been prepared under the direction and in the presence of Matthew FortsJeff Epic Tribbett, PA-C Electronically Signed: Soijett Alvarado, ED Scribe. 08/14/16. 12:19 PM.  History   Chief Complaint Chief Complaint  Patient presents with  . Suicidal     HPI  Matthew Alvarado is a 41 y.o. male with a PMHx of HTN, who presents to the Emergency Department complaining of increased feelings of depression onset last night. Pt reports associated SI x last night. Pt has not tried any medications for the relief of his symptoms. Pt denies a specific plan, but notes that he wanted to "lay down and not move anymore." He states that he has had suicidal thoughts in the past, but has never been evaluated for his symptoms. He reports that he does use marijuana and cocaine with his last use being yesterday and he states "I was hoping it would kill me." He denies HI, fever, chills, and any other symptoms.   The history is provided by the patient. No language interpreter was used.    Past Medical History:  Diagnosis Date  . Hypertension     There are no active problems to display for this patient.   History reviewed. No pertinent surgical history.     Home Medications    Prior to Admission medications   Medication Sig Start Date End Date Taking? Authorizing Provider  ibuprofen (ADVIL,MOTRIN) 400 MG tablet Take 400 mg by mouth every 6 (six) hours as needed for moderate pain.   Yes [provider]  dicyclomine (BENTYL) 20 MG tablet Take 1 tablet (20 mg total) by mouth every 6 (six) hours as needed for spasms (for abdominal cramping). Patient not taking: Reported on 10/10/2014 09/07/14   Marisa Severintter, Olga, MD  hydrOXYzine (ATARAX/VISTARIL) 25 MG tablet Take 1 tablet (25 mg total) by mouth every 6 (six) hours as needed for anxiety or nausea. Patient not  taking: Reported on 10/10/2014 09/07/14   Marisa Severintter, Olga, MD  loperamide (IMODIUM) 2 MG capsule Take 1-2 capsules (2-4 mg total) by mouth as needed for diarrhea Patient not taking: Reported on 10/10/2014 09/07/14   Marisa Severintter, Olga, MD  methocarbamol (ROBAXIN) 500 MG tablet Take 1 tablet (500 mg total) by mouth every 8 (eight) hours as needed for muscle spasms. Patient not taking: Reported on 10/10/2014 09/07/14   Marisa Severintter, Olga, MD  naproxen (NAPROSYN) 500 MG tablet Take 1 tablet (500 mg total) by mouth 2 (two) times daily with a meal. Patient not taking: Reported on 10/10/2014 09/07/14   Marisa Severintter, Olga, MD  oxyCODONE-acetaminophen (PERCOCET/ROXICET) 5-325 MG per tablet Take 1 tablet by mouth every 8 (eight) hours as needed for severe pain. Patient not taking: Reported on 08/14/2016 10/10/14   Fayrene Helperran, Bowie, PA-C    Family History No family history on file.  Social History Social History  Substance Use Topics  . Smoking status: Current Every Day Smoker    Packs/day: 1.00    Types: Cigarettes  . Smokeless tobacco: Not on file  . Alcohol use No     Allergies   Vicodin [hydrocodone-acetaminophen]   Review of Systems Review of Systems  Constitutional: Negative for chills and fever.  Psychiatric/Behavioral: Positive for suicidal ideas.       +Increased feelings of depression. No HI  All other systems reviewed and are negative.    Physical Exam Updated Vital Signs BP  140/75 (BP Location: Left Arm)   Pulse 70   Temp 98.5 F (36.9 C) (Oral)   Resp 18   SpO2 100%   Physical Exam  Constitutional: He is oriented to person, place, and time. He appears well-developed and well-nourished. No distress.  HENT:  Head: Normocephalic and atraumatic.  Eyes: EOM are normal.  Neck: Neck supple.  Cardiovascular: Normal rate.   Pulmonary/Chest: Effort normal. No respiratory distress.  Abdominal: He exhibits no distension.  Musculoskeletal: Normal range of motion.  Neurological: He is alert and oriented to person,  place, and time.  Skin: Skin is warm and dry.  Psychiatric: His behavior is normal. He exhibits a depressed mood. He expresses suicidal ideation. He expresses no homicidal ideation. He expresses suicidal plans. He expresses no homicidal plans.  Tearful.   Nursing note and vitals reviewed.    ED Treatments / Results  DIAGNOSTIC STUDIES: Oxygen Saturation is 100% on RA, nl by my interpretation.    COORDINATION OF CARE: 12:17 PM Discussed treatment plan with pt at bedside which includes labs, UDS and pt agreed to plan.   Labs (all labs ordered are listed, but only abnormal results are displayed) Labs Reviewed  COMPREHENSIVE METABOLIC PANEL - Abnormal; Notable for the following:       Result Value   Glucose, Bld 108 (*)    Creatinine, Ser 1.44 (*)    ALT 12 (*)    GFR calc non Af Amer 60 (*)    All other components within normal limits  RAPID URINE DRUG SCREEN, HOSP PERFORMED - Abnormal; Notable for the following:    Cocaine POSITIVE (*)    Tetrahydrocannabinol POSITIVE (*)    All other components within normal limits  ETHANOL  CBC WITH DIFFERENTIAL/PLATELET    EKG  EKG Interpretation None       Radiology No results found.  Procedures Procedures (including critical care time)  Medications Ordered in ED Medications  ibuprofen (ADVIL,MOTRIN) tablet 400 mg (not administered)     Initial Impression / Assessment and Plan / ED Course  I have reviewed the triage vital signs and the nursing notes.  Pertinent labs & imaging results that were available during my care of the patient were reviewed by me and considered in my medical decision making (see chart for details).      41 year old male presents today with complaints of depression.  He is significantly depressed, is tearful in the room with suicidal ideations.  Behavioral health will be consulted.  Patient is medically cleared awaiting TTS.    Final Clinical Impressions(s) / ED Diagnoses   Final diagnoses:    Suicidal ideations    Assessment/Plan: 41 year old male presents today with complaints of depression.  He is significantly depressed, is tearful in the room with suicidal ideations.  Behavioral health will be consulted.  Patient is medically cleared awaiting TTS.  TTS recommends inpatient management.    New Prescriptions New Prescriptions   No medications on file  I personally performed the services described in this documentation, which was scribed in my presence. The recorded information has been reviewed and is accurate.   Eyvonne Mechanic, PA-C 08/14/16 Primus Bravo, MD 08/15/16 [phone]

## 2016-08-14 NOTE — ED Notes (Signed)
Patient changed into burgundy scrubs and wanded by security. 
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
## 2016-08-14 NOTE — ED Notes (Signed)
TTS with patient. 

## 2016-08-14 NOTE — ED Notes (Signed)
Pt admitted to room # 41. Pt behavior cooperative, pleasant on approach, sad affect. Pt reports increase in depression. Pt identifies current stressor as family. Pt endorsing passive SI, verbally contacts for safety. Pt denies HI/AVH. Pt denies hx of mental illness. Pt reports "feeling alone" Encouragement and support provided. Special checks q 15 mins in place for safety, Video monitoring in place. Will continue to monitor.

## 2016-08-14 NOTE — ED Triage Notes (Signed)
Per states "I am just feeling down; I just feel like laying down and not moving." Pt denies SI/HI. Pt verbalizes today planned to move out of mother's house but doesn't like being alone.

## 2016-08-14 NOTE — ED Notes (Signed)
When pt spoke with PA verbalized SI last night.

## 2016-08-14 NOTE — ED Notes (Signed)
Pt talking on hallway phone.  

## 2016-08-14 NOTE — ED Notes (Signed)
SBAR Report received from previous nurse. Pt received calm and visible on unit. Pt denies current SI/ HI, A/V H, depression, anxiety, or pain at this time, and appears otherwise stable and free of distress. Pt reminded of camera surveillance, q 15 min rounds, and rules of the milieu. Will continue to assess. 

## 2016-08-15 ENCOUNTER — Encounter (HOSPITAL_COMMUNITY): Payer: Self-pay | Admitting: Registered Nurse

## 2016-08-15 DIAGNOSIS — F1490 Cocaine use, unspecified, uncomplicated: Secondary | ICD-10-CM

## 2016-08-15 DIAGNOSIS — F17210 Nicotine dependence, cigarettes, uncomplicated: Secondary | ICD-10-CM

## 2016-08-15 DIAGNOSIS — F4329 Adjustment disorder with other symptoms: Secondary | ICD-10-CM

## 2016-08-15 DIAGNOSIS — R45851 Suicidal ideations: Secondary | ICD-10-CM

## 2016-08-15 DIAGNOSIS — F1290 Cannabis use, unspecified, uncomplicated: Secondary | ICD-10-CM

## 2016-08-15 NOTE — ED Notes (Signed)
Pt talking on hallway phone.  

## 2016-08-15 NOTE — ED Notes (Signed)
Pt d/c home per MD order. Discharge summary reviewed with pt. Pt verbalizes understanding of discharge summary. Pt denies SI/HI/AVH. Pt signed for personal property and property returned. Pt signed e-signature. Ambulatory off unit.

## 2016-08-15 NOTE — [facility] Suicide Risk Assessment (Cosign Needed)
Suicide Risk Assessment  Discharge Assessment   [facility]   Principal Problem: Adjustment disorder with emotional disturbance Discharge Diagnoses:  Patient Active Problem List   Diagnosis Date Noted  . Adjustment disorder with emotional disturbance [F43.29] 08/15/2016    Total Time spent with patient: 30 minutes  Musculoskeletal: Strength & Muscle Tone: within normal limits Gait & Station: normal Patient leans: N/A  Psychiatric Specialty Exam:   Blood pressure 121/68, pulse 72, temperature 98.7 F (37.1 C), temperature source Oral, resp. rate 16, SpO2 100 %.There is no height or weight on file to calculate BMI.   General Appearance: Casual  Eye Contact:  Good  Speech:  Clear and Coherent  Volume:  Normal  Mood:  "Good"  Affect:  Appropriate  Thought Process:  Goal Directed  Orientation:  Full (Time, Place, and Person)  Thought Content:  Logical  Suicidal Thoughts:  No  Homicidal Thoughts:  No  Memory:  Immediate;   Good Recent;   Good Remote;   Good  Judgement:  Intact  Insight:  Present  Psychomotor Activity:  Normal  Concentration:  Concentration: Good and Attention Span: Good  Recall:  Good  Fund of Knowledge:  Fair  Language:  Good  Akathisia:  No  Handed:  Right  AIMS (if indicated):     Assets:  Communication Skills Desire for Improvement Social Support  ADL's:  Intact  Cognition:  WNL  Sleep:         Mental Status Per Nursing Assessment::   On Admission:   Suicidal ideation  Demographic Factors:  Male  Loss Factors: NA  Historical Factors: Impulsivity  Risk Reduction Factors:   Sense of responsibility to family and Living with another person, especially a relative  Continued Clinical Symptoms:  Alcohol/Substance Abuse/Dependencies  Cognitive Features That Contribute To Risk:  None    Suicide Risk:  Minimal: No identifiable suicidal ideation.  Patients presenting with no risk factors but with morbid  ruminations; may be classified as minimal risk based on the severity of the depressive symptoms    Plan Of Care/Follow-up recommendations:  Activity:  As tolerated Diet:  Heart healthy  Rankin, Shuvon, NP 08/15/2016, 12:03 PM

## 2016-08-15 NOTE — Consult Note (Signed)
[facility] Face-to-Face Psychiatry Consult   Reason for Consult:  Suicidal ideation Referring Physician:  EDP Patient Identification: Matthew Alvarado MRN:  [id number] Principal Diagnosis: Adjustment disorder with emotional disturbance Diagnosis:   Patient Active Problem List   Diagnosis Date Noted  . Adjustment disorder with emotional disturbance [F43.29] 08/15/2016    Total Time spent with patient: 45 minutes  Subjective:   Tel Matthew Alvarado is a 41 y.o. male patient is stabilized.  HPI:  Matthew Alvarado 41 y.o. male patient seen by Dr. Gilmore Laroche and this provider.  Chart reviewed and face to face evaluation on 08/15/16.   On evaluation:  Matthew Alvarado reports he was feeling stressed about his living situation, work and the breakup with his girlfriend.  State that he is feeling better today and just wanted resources.  Patient states that he is feeling much better since he has had time to rest.  At this time patient denies suicidal/homicidal ideation, psychosis, and paranoia    Past Psychiatric History: Polysubstance abuse  Risk to Self: Suicidal Ideation: No-Currently Present Suicidal Intent: No Is patient at risk for suicide?: No Suicidal Plan?: No  Access to Means: Yes Specify Access to Suicidal Means: drug use What has been your use of drugs/alcohol within the last 12 months?: Cocaine weekly; Marijuana daily Intentional Self Injurious Behavior: None Risk to Others: Homicidal Ideation: No Thoughts of Harm to Others: No Current Homicidal Intent: No Current Homicidal Plan: No Access to Homicidal Means: No History of harm to others?: No Assessment of Violence: None Noted Does patient have access to weapons?: No Criminal Charges Pending?: No Does patient have a court date: No Prior Inpatient Therapy: Prior Inpatient Therapy: No Prior Outpatient Therapy: Prior Outpatient Therapy: No Does patient have an ACCT team?: No Does patient have Intensive In-House Services?  : No Does patient have  [facility] services? : No Does patient have [facility] services?: No  Past Medical History:  Past Medical History:  Diagnosis Date  . Hypertension    History reviewed. No pertinent surgical history. Family History: History reviewed. No pertinent family history. Family Psychiatric  History: Denies Social History:  History  Alcohol Use No     History  Drug Use  . Frequency: 7.0 times per week  . Types: Cocaine, Marijuana    Comment: Daily THC; weekly cocaine    Social History   Social History  . Marital status: Single    Spouse name: N/A  . Number of children: N/A  . Years of education: N/A   Social History Main Topics  . Smoking status: Current Every Day Smoker    Packs/day: 1.00    Types: Cigarettes  . Smokeless tobacco: None  . Alcohol use No  . Drug use: Yes    Frequency: 7.0 times per week    Types: Cocaine, Marijuana     Comment: Daily THC; weekly cocaine  . Sexual activity: Not Currently   Other Topics Concern  . None   Social History Narrative  . None   Additional Social History:    Allergies:   Allergies  Allergen Reactions  . Vicodin [Hydrocodone-Acetaminophen] Nausea Only    Labs:  Results for orders placed or performed during the hospital encounter of 08/14/16 (from the past 48 hour(s))  Comprehensive metabolic panel     Status: Abnormal   Collection Time: 08/14/16 12:18 PM  Result Value Ref Range   Sodium 138 135 - 145 mmol/L   Potassium 3.8 3.5 - 5.1 mmol/L   Chloride 107 101 - 111 mmol/L  CO2 24 22 - 32 mmol/L   Glucose, Bld 108 (H) 65 - 99 mg/dL   BUN 12 6 - 20 mg/dL   Creatinine, Ser 1.44 (H) 0.61 - 1.24 mg/dL   Calcium 9.2 8.9 - 10.3 mg/dL   Total Protein 7.7 6.5 - 8.1 g/dL   Albumin 4.3 3.5 - 5.0 g/dL   AST 32 15 - 41 U/L   ALT 12 (L) 17 - 63 U/L   Alkaline Phosphatase 64 38 - 126 U/L   Total Bilirubin 0.9 0.3 - 1.2 mg/dL   GFR calc non Af Amer 60 (L) >60 mL/min   GFR calc Af Amer >60 >60 mL/min    Comment: (NOTE) The eGFR has been  calculated using the CKD EPI equation. This calculation has not been validated in all clinical situations. eGFR's persistently <60 mL/min signify possible Chronic Kidney Disease.    Anion gap 7 5 - 15  Ethanol     Status: None   Collection Time: 08/14/16 12:18 PM  Result Value Ref Range   Alcohol, Ethyl (B) <5 <5 mg/dL    Comment:        LOWEST DETECTABLE LIMIT FOR SERUM ALCOHOL IS 5 mg/dL FOR MEDICAL PURPOSES ONLY   CBC with Diff     Status: None   Collection Time: 08/14/16 12:18 PM  Result Value Ref Range   WBC 9.6 4.0 - 10.5 K/uL   RBC 4.81 4.22 - 5.81 MIL/uL   Hemoglobin 14.6 13.0 - 17.0 g/dL   HCT 42.9 39.0 - 52.0 %   MCV 89.2 78.0 - 100.0 fL   MCH 30.4 26.0 - 34.0 pg   MCHC 34.0 30.0 - 36.0 g/dL   RDW 13.1 11.5 - 15.5 %   Platelets 234 150 - 400 K/uL   Neutrophils Relative % 54 %   Neutro Abs 5.2 1.7 - 7.7 K/uL   Lymphocytes Relative 37 %   Lymphs Abs 3.6 0.7 - 4.0 K/uL   Monocytes Relative 6 %   Monocytes Absolute 0.6 0.1 - 1.0 K/uL   Eosinophils Relative 3 %   Eosinophils Absolute 0.2 0.0 - 0.7 K/uL   Basophils Relative 0 %   Basophils Absolute 0.0 0.0 - 0.1 K/uL  Urine rapid drug screen (hosp performed)not at [facility]     Status: Abnormal   Collection Time: 08/14/16 12:18 PM  Result Value Ref Range   Opiates NONE DETECTED NONE DETECTED   Cocaine POSITIVE (A) NONE DETECTED   Benzodiazepines NONE DETECTED NONE DETECTED   Amphetamines NONE DETECTED NONE DETECTED   Tetrahydrocannabinol POSITIVE (A) NONE DETECTED   Barbiturates NONE DETECTED NONE DETECTED    Comment:        DRUG SCREEN FOR MEDICAL PURPOSES ONLY.  IF CONFIRMATION IS NEEDED FOR ANY PURPOSE, NOTIFY LAB WITHIN 5 DAYS.        LOWEST DETECTABLE LIMITS FOR URINE DRUG SCREEN Drug Class       Cutoff (ng/mL) Amphetamine      1000 Barbiturate      200 Benzodiazepine   673 Tricyclics       419 Opiates          300 Cocaine          300 THC              50     Current Facility-Administered  Medications  Medication Dose Route Frequency Provider Last Rate Last Dose  . ibuprofen (ADVIL,MOTRIN) tablet 400 mg  400 mg Oral Q6H PRN Okey Regal,  PA-C       Current Outpatient Prescriptions  Medication Sig Dispense Refill  . ibuprofen (ADVIL,MOTRIN) 400 MG tablet Take 400 mg by mouth every 6 (six) hours as needed for moderate pain.    Marland Kitchen dicyclomine (BENTYL) 20 MG tablet Take 1 tablet (20 mg total) by mouth every 6 (six) hours as needed for spasms (for abdominal cramping). (Patient not taking: Reported on 10/10/2014) 20 tablet 0  . hydrOXYzine (ATARAX/VISTARIL) 25 MG tablet Take 1 tablet (25 mg total) by mouth every 6 (six) hours as needed for anxiety or nausea. (Patient not taking: Reported on 10/10/2014) 20 tablet 0  . loperamide (IMODIUM) 2 MG capsule Take 1-2 capsules (2-4 mg total) by mouth as needed for diarrhea (Patient not taking: Reported on 10/10/2014) 20 capsule 0  . methocarbamol (ROBAXIN) 500 MG tablet Take 1 tablet (500 mg total) by mouth every 8 (eight) hours as needed for muscle spasms. (Patient not taking: Reported on 10/10/2014) 20 tablet 0  . naproxen (NAPROSYN) 500 MG tablet Take 1 tablet (500 mg total) by mouth 2 (two) times daily with a meal. (Patient not taking: Reported on 10/10/2014) 30 tablet 0  . oxyCODONE-acetaminophen (PERCOCET/ROXICET) 5-325 MG per tablet Take 1 tablet by mouth every 8 (eight) hours as needed for severe pain. (Patient not taking: Reported on 08/14/2016) 20 tablet 0    Musculoskeletal: Strength & Muscle Tone: within normal limits Gait & Station: normal Patient leans: N/A  Psychiatric Specialty Exam: Physical Exam  Vitals reviewed. Constitutional: He is oriented to person, place, and time.  Neck: Normal range of motion.  Respiratory: Effort normal.  Musculoskeletal: Normal range of motion.  Neurological: He is alert and oriented to person, place, and time.    Review of Systems  Psychiatric/Behavioral: Positive for substance abuse. Negative for  hallucinations and suicidal ideas. Depression: Stable. Nervous/anxious: Stable.   All other systems reviewed and are negative.   Blood pressure 121/68, pulse 72, temperature 98.7 F (37.1 C), temperature source Oral, resp. rate 16, SpO2 100 %.There is no height or weight on file to calculate BMI.  General Appearance: Casual  Eye Contact:  Good  Speech:  Clear and Coherent  Volume:  Normal  Mood:  "Good"  Affect:  Appropriate  Thought Process:  Goal Directed  Orientation:  Full (Time, Place, and Person)  Thought Content:  Logical  Suicidal Thoughts:  No  Homicidal Thoughts:  No  Memory:  Immediate;   Good Recent;   Good Remote;   Good  Judgement:  Intact  Insight:  Present  Psychomotor Activity:  Normal  Concentration:  Concentration: Good and Attention Span: Good  Recall:  Good  Fund of Knowledge:  Fair  Language:  Good  Akathisia:  No  Handed:  Right  AIMS (if indicated):     Assets:  Communication Skills Desire for Improvement Social Support  ADL's:  Intact  Cognition:  WNL  Sleep:        Treatment Plan Summary: Plan Discharge home; follow up with [facility]  Disposition: No evidence of imminent risk to self or others at present.   Patient does not meet criteria for psychiatric inpatient admission.  Matthew Newport, NP 08/15/2016 11:44 AM  Patient seen, case reviewed, agree with notes and plan

## 2016-08-15 NOTE — Discharge Instructions (Signed)
For your ongoing mental health needs, you are advised to follow up with [facility].  New and returning patients are seen at their walk-in clinic.  Walk-in hours are Monday - Friday from 8:00 am - 3:00 pm.  Walk-in patients are seen on a first come, first served basis.  Try to arrive as early as possible for he best chance of being seen the same day: ° °     [facility] °     [address] °     [place], [place] [address] °     [phone] °

## 2017-01-20 ENCOUNTER — Encounter (HOSPITAL_COMMUNITY): Payer: Self-pay

## 2017-01-20 ENCOUNTER — Emergency Department (HOSPITAL_COMMUNITY): Payer: Self-pay

## 2017-01-20 ENCOUNTER — Emergency Department (HOSPITAL_COMMUNITY)
Admission: EM | Admit: 2017-01-20 | Discharge: 2017-01-20 | Disposition: A | Discharge status: Home / Self Care | Payer: Self-pay | Attending: Emergency Medicine | Admitting: Emergency Medicine

## 2017-01-20 DIAGNOSIS — T148XXA Other injury of unspecified body region, initial encounter: Secondary | ICD-10-CM

## 2017-01-20 DIAGNOSIS — X500XXA Overexertion from strenuous movement or load, initial encounter: Secondary | ICD-10-CM | POA: Insufficient documentation

## 2017-01-20 DIAGNOSIS — S92425A Nondisplaced fracture of distal phalanx of left great toe, initial encounter for closed fracture: Secondary | ICD-10-CM | POA: Insufficient documentation

## 2017-01-20 DIAGNOSIS — Z791 Long term (current) use of non-steroidal anti-inflammatories (NSAID): Secondary | ICD-10-CM | POA: Insufficient documentation

## 2017-01-20 DIAGNOSIS — Y9389 Activity, other specified: Secondary | ICD-10-CM | POA: Insufficient documentation

## 2017-01-20 DIAGNOSIS — Z79899 Other long term (current) drug therapy: Secondary | ICD-10-CM | POA: Insufficient documentation

## 2017-01-20 DIAGNOSIS — Y999 Unspecified external cause status: Secondary | ICD-10-CM | POA: Insufficient documentation

## 2017-01-20 DIAGNOSIS — F17210 Nicotine dependence, cigarettes, uncomplicated: Secondary | ICD-10-CM | POA: Insufficient documentation

## 2017-01-20 DIAGNOSIS — I10 Essential (primary) hypertension: Secondary | ICD-10-CM | POA: Insufficient documentation

## 2017-01-20 DIAGNOSIS — M25532 Pain in left wrist: Secondary | ICD-10-CM | POA: Insufficient documentation

## 2017-01-20 DIAGNOSIS — Y929 Unspecified place or not applicable: Secondary | ICD-10-CM | POA: Insufficient documentation

## 2017-01-20 MED ORDER — ACETAMINOPHEN 325 MG PO TABS
650.0000 mg | ORAL_TABLET | Freq: Once | ORAL | Status: AC
  Administered 2017-01-20: 650 mg via ORAL
  Filled 2017-01-20: qty 2

## 2017-01-20 MED ORDER — BACITRACIN ZINC 500 UNIT/GM EX OINT
TOPICAL_OINTMENT | Freq: Two times a day (BID) | CUTANEOUS | Status: DC
  Administered 2017-01-20: 1 via TOPICAL
  Filled 2017-01-20: qty 0.9

## 2017-01-20 MED ORDER — OXYCODONE-ACETAMINOPHEN 5-325 MG PO TABS: 2.0000 | ORAL_TABLET | ORAL | 0 refills | Status: AC | PRN

## 2017-01-20 MED ORDER — IBUPROFEN 200 MG PO TABS
600.0000 mg | ORAL_TABLET | Freq: Once | ORAL | Status: AC
  Administered 2017-01-20: 600 mg via ORAL
  Filled 2017-01-20: qty 3

## 2017-01-20 MED ORDER — TETANUS-DIPHTH-ACELL PERTUSSIS 5-2.5-18.5 LF-MCG/0.5 IM SUSP
0.5000 mL | Freq: Once | INTRAMUSCULAR | Status: AC
  Administered 2017-01-20: 0.5 mL via INTRAMUSCULAR
  Filled 2017-01-20: qty 0.5
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
## 2017-01-20 NOTE — Discharge Instructions (Signed)
Have given you short course of percocet as needed for pain that is not controlled by motrin. Ice affected area (see instructions below).  Please call the orthopedic physician listed today or first thing in the morning to schedule a follow up appointment. Where the postop shoe for comfort. Apply the buddy tape. Use the crutches for weightbearing as tolerated.  Your wrist shows no signs of fracture. Wear the splint for your wrist and follow-up with the orthopedic doctor. Watch for signs of infection of the abrasion.  Please rest, ice, compress and elevated the affected body part to help with swelling and pain.   Fractures generally take 4-6 weeks to heal. It is very important to keep your splint dry until your follow up with the orthopedic doctor and a cast can be applied. You may place a plastic bag around the extremity with the splint while bathing to keep it dry. Also try to sleep with the extremity elevated for the next several nights to decrease swelling. Check the fingertips and toes several times per day to make sure they are not cold, pale, or blue. If this is the case, the splint may be too tight and should return to the ER, your regular doctor or the orthopedist for recheck. Return to the ER for new or worsening symptoms, any additional concerns.   COLD THERAPY DIRECTIONS:  Ice or gel packs can be used to reduce both pain and swelling. Ice is the most helpful within the first 24 to 48 hours after an injury or flareup from overusing a muscle or joint.  Ice is effective, has very few side effects, and is safe for most people to use.   If you expose your skin to cold temperatures for too long or without the proper protection, you can damage your skin or nerves. Watch for signs of skin damage due to cold.   HOME CARE INSTRUCTIONS  Follow these tips to use ice and cold packs safely.  Place a dry or damp towel between the ice and skin. A damp towel will cool the skin more quickly, so you may need  to shorten the time that the ice is used.  For a more rapid response, add gentle compression to the ice.  Ice for no more than 10 to 20 minutes at a time. The bonier the area you are icing, the less time it will take to get the benefits of ice.  Check your skin after 5 minutes to make sure there are no signs of a poor response to cold or skin damage.  Rest 20 minutes or more in between uses.  Once your skin is numb, you can end your treatment. You can test numbness by very lightly touching your skin. The touch should be so light that you do not see the skin dimple from the pressure of your fingertip. When using ice, most people will feel these normal sensations in this order: cold, burning, aching, and numbness.

## 2018-11-04 ENCOUNTER — Encounter (HOSPITAL_COMMUNITY): Payer: Self-pay

## 2018-11-04 ENCOUNTER — Emergency Department (HOSPITAL_COMMUNITY)
Admission: EM | Admit: 2018-11-04 | Discharge: 2018-11-04 | Disposition: A | Discharge status: Home / Self Care | Payer: Self-pay | Attending: Emergency Medicine | Admitting: Emergency Medicine

## 2018-11-04 DIAGNOSIS — Z885 Allergy status to narcotic agent status: Secondary | ICD-10-CM | POA: Insufficient documentation

## 2018-11-04 DIAGNOSIS — T675XXA Heat exhaustion, unspecified, initial encounter: Secondary | ICD-10-CM | POA: Insufficient documentation

## 2018-11-04 DIAGNOSIS — Z590 Homelessness: Secondary | ICD-10-CM | POA: Insufficient documentation

## 2018-11-04 DIAGNOSIS — R252 Cramp and spasm: Secondary | ICD-10-CM | POA: Insufficient documentation

## 2018-11-04 DIAGNOSIS — F17210 Nicotine dependence, cigarettes, uncomplicated: Secondary | ICD-10-CM | POA: Insufficient documentation

## 2018-11-04 LAB — COMPREHENSIVE METABOLIC PANEL
ALT: 16 U/L (ref 0–44)
AST: 36 U/L (ref 15–41)
Albumin: 4.1 g/dL (ref 3.5–5.0)
Alkaline Phosphatase: 59 U/L (ref 38–126)
Anion gap: 10 (ref 5–15)
BUN: 10 mg/dL (ref 6–20)
CO2: 22 mmol/L (ref 22–32)
Calcium: 9.2 mg/dL (ref 8.9–10.3)
Chloride: 110 mmol/L (ref 98–111)
Creatinine, Ser: 1.31 mg/dL — ABNORMAL HIGH (ref 0.61–1.24)
GFR calc Af Amer: >60 mL/min (ref >60)
GFR calc non Af Amer: >60 mL/min (ref >60)
Glucose, Bld: 87 mg/dL (ref 70–99)
Potassium: 3.9 mmol/L (ref 3.5–5.1)
Sodium: 142 mmol/L (ref 135–145)
Total Bilirubin: 0.7 mg/dL (ref 0.3–1.2)
Total Protein: 7.2 g/dL (ref 6.5–8.1)

## 2018-11-04 LAB — CBC WITH DIFFERENTIAL/PLATELET
Abs Immature Granulocytes: 0.02 10*3/uL (ref 0.00–0.07)
Basophils Absolute: 0 10*3/uL (ref 0.0–0.1)
Basophils Relative: 0 %
Eosinophils Absolute: 0 10*3/uL (ref 0.0–0.5)
Eosinophils Relative: 0 %
HCT: 40.7 % (ref 39.0–52.0)
Hemoglobin: 13.2 g/dL (ref 13.0–17.0)
Immature Granulocytes: 0 %
Lymphocytes Relative: 29 %
Lymphs Abs: 2.5 10*3/uL (ref 0.7–4.0)
MCH: 30.5 pg (ref 26.0–34.0)
MCHC: 32.4 g/dL (ref 30.0–36.0)
MCV: 94 fL (ref 80.0–100.0)
Monocytes Absolute: 0.5 10*3/uL (ref 0.1–1.0)
Monocytes Relative: 6 %
Neutro Abs: 5.8 10*3/uL (ref 1.7–7.7)
Neutrophils Relative %: 65 %
Platelets: 202 10*3/uL (ref 150–400)
RBC: 4.33 MIL/uL (ref 4.22–5.81)
RDW: 13.5 % (ref 11.5–15.5)
WBC: 8.9 10*3/uL (ref 4.0–10.5)
nRBC: 0 % (ref 0.0–0.2)

## 2018-11-04 LAB — TROPONIN I (HIGH SENSITIVITY): Troponin I (High Sensitivity): 8 ng/L (ref <18)

## 2018-11-04 NOTE — ED Notes (Signed)
Pt complaining of not having anything to drink due to waiting on lab results, but stated that was over 3 hours prior. RN Meghan B. Advised pt could have some water. Provided pt with water in lobby.

## 2018-11-04 NOTE — ED Triage Notes (Signed)
Pt states he has been walking around outside in the heat for 3 days. Pt states that he is very weak and dehydrated. Pt states that he has had leg cramping. Pt states he has tried but cannot get into the shelter.

## 2018-11-04 NOTE — ED Notes (Signed)
Pt in no distress. Ambulatory to the lobby where his ride is waiting.

## 2018-11-04 NOTE — ED Provider Notes (Signed)
[facility]-EMERGENCY DEPT Provider Note   CSN: [id number] Arrival date & time: 11/04/18  1416    History   Chief Complaint Chief Complaint  Patient presents with  . Heat Exposure    HPI Matthew Alvarado is a 43 y.o. male.     Patient is a 43 year old male who is currently homeless and states that he feels like he has been out in the heat too long.  He states that he is been out in the heat and homeless for the last 3 days.  He has not really been drinking well.  He has had some muscle cramping and she was complaining of some chest pain all day today although he cannot really give me a good description of the pain.  He describes it as a small pain in the center of his chest.  No shortness of breath.  No nausea or vomiting.  He had a little bit of dizziness earlier.  He has not eaten anything in several hours either.     Past Medical History:  Diagnosis Date  . Hypertension     Patient Active Problem List   Diagnosis Date Noted  . Adjustment disorder with emotional disturbance 08/15/2016  . Suicidal ideations     History reviewed. No pertinent surgical history.      Home Medications    Prior to Admission medications   Medication Sig Start Date End Date Taking? Authorizing Provider  dicyclomine (BENTYL) 20 MG tablet Take 1 tablet (20 mg total) by mouth every 6 (six) hours as needed for spasms (for abdominal cramping). Patient not taking: Reported on 10/10/2014 09/07/14   Marisa Severintter, Olga, MD  hydrOXYzine (ATARAX/VISTARIL) 25 MG tablet Take 1 tablet (25 mg total) by mouth every 6 (six) hours as needed for anxiety or nausea. Patient not taking: Reported on 10/10/2014 09/07/14   Marisa Severintter, Olga, MD  ibuprofen (ADVIL,MOTRIN) 400 MG tablet Take 400 mg by mouth every 6 (six) hours as needed for moderate pain.    [provider]  loperamide (IMODIUM) 2 MG capsule Take 1-2 capsules (2-4 mg total) by mouth as needed for diarrhea Patient not taking: Reported on  10/10/2014 09/07/14   Marisa Severintter, Olga, MD  methocarbamol (ROBAXIN) 500 MG tablet Take 1 tablet (500 mg total) by mouth every 8 (eight) hours as needed for muscle spasms. Patient not taking: Reported on 10/10/2014 09/07/14   Marisa Severintter, Olga, MD  naproxen (NAPROSYN) 500 MG tablet Take 1 tablet (500 mg total) by mouth 2 (two) times daily with a meal. Patient not taking: Reported on 10/10/2014 09/07/14   Marisa Severintter, Olga, MD  oxyCODONE-acetaminophen (PERCOCET/ROXICET) 5-325 MG tablet Take 2 tablets by mouth every 4 (four) hours as needed for severe pain. 01/20/17   Rise MuLeaphart, Kenneth T, PA-C    Family History No family history on file.  Social History Social History   Tobacco Use  . Smoking status: Current Every Day Smoker    Packs/day: 1.00    Types: Cigarettes  . Smokeless tobacco: Never Used  Substance Use Topics  . Alcohol use: No  . Drug use: Yes    Frequency: 7.0 times per week    Types: Marijuana    Comment: daily marijuana     Allergies   Vicodin [hydrocodone-acetaminophen]   Review of Systems Review of Systems  Constitutional: Positive for fatigue. Negative for chills, diaphoresis and fever.  HENT: Negative for congestion, rhinorrhea and sneezing.   Eyes: Negative.   Respiratory: Negative for cough, chest tightness and shortness of breath.  Cardiovascular: Positive for chest pain. Negative for leg swelling.  Gastrointestinal: Negative for abdominal pain, blood in stool, diarrhea, nausea and vomiting.  Genitourinary: Negative for difficulty urinating, flank pain, frequency and hematuria.  Musculoskeletal: Positive for myalgias. Negative for arthralgias and back pain.  Skin: Negative for rash.  Neurological: Positive for light-headedness. Negative for dizziness, speech difficulty, weakness, numbness and headaches.     Physical Exam Updated Vital Signs BP 129/74   Pulse (!) 50   Temp 98.7 F (37.1 C) (Oral)   Resp 19   Ht 5\' 6"  (1.676 m)   Wt 70.3 kg   SpO2 100%   BMI 25.02 kg/m    Physical Exam Constitutional:      Appearance: He is well-developed.  HENT:     Head: Normocephalic and atraumatic.  Eyes:     Pupils: Pupils are equal, round, and reactive to light.  Neck:     Musculoskeletal: Normal range of motion and neck supple.  Cardiovascular:     Rate and Rhythm: Normal rate and regular rhythm.     Heart sounds: Normal heart sounds.  Pulmonary:     Effort: Pulmonary effort is normal. No respiratory distress.     Breath sounds: Normal breath sounds. No wheezing or rales.  Chest:     Chest wall: No tenderness.  Abdominal:     General: Bowel sounds are normal.     Palpations: Abdomen is soft.     Tenderness: There is no abdominal tenderness. There is no guarding or rebound.  Musculoskeletal: Normal range of motion.  Lymphadenopathy:     Cervical: No cervical adenopathy.  Skin:    General: Skin is warm and dry.     Findings: No rash.  Neurological:     Mental Status: He is alert and oriented to person, place, and time.      ED Treatments / Results  Labs (all labs ordered are listed, but only abnormal results are displayed) Labs Reviewed  COMPREHENSIVE METABOLIC PANEL - Abnormal; Notable for the following components:      Result Value   Creatinine, Ser 1.31 (*)    All other components within normal limits  CBC WITH DIFFERENTIAL/PLATELET  TROPONIN I (HIGH SENSITIVITY)  TROPONIN I (HIGH SENSITIVITY)    EKG EKG Interpretation  Date/Time:  Saturday November 04 2018 21:45:35 EDT Ventricular Rate:  50 PR Interval:    QRS Duration: 98 QT Interval:  412 QTC Calculation: 376 R Axis:   63 Text Interpretation:  Sinus rhythm ST elev, probable normal early repol pattern No old tracing to compare Confirmed by Malvin Johns [phone]) on 11/04/2018 9:59:40 PM   Radiology No results found.  Procedures Procedures (including critical care time)  Medications Ordered in ED Medications - No data to display   Initial Impression / Assessment and Plan / ED  Course  I have reviewed the triage vital signs and the nursing notes.  Pertinent labs & imaging results that were available during my care of the patient were reviewed by me and considered in my medical decision making (see chart for details).        Patient is a 43 year old male who presents with heat exhaustion.  His labs are non-concerning.  He has some chronic elevation in his creatinine.  He was able to drink fluids without problem.  He was given something to eat.  He is feeling much better after observation.  His troponin is negative.  I do not feel that he needs a repeat troponin given that  his chest pain is been fairly constant and seems to be related to muscle cramping that he was having with his heat exhaustion.  He is not currently have any symptoms.  He was discharged in good condition.  Return precautions were given.  Final Clinical Impressions(s) / ED Diagnoses   Final diagnoses:  Heat exhaustion, initial encounter    ED Discharge Orders    None       Rolan BuccoBelfi, Kiala Faraj, MD 11/04/18 2250

## 2019-08-29 IMAGING — CR DG WRIST COMPLETE 3+V*L*
4 series · 4 of 4 positions shown · non-contrast
Comparison: None.

CLINICAL DATA: Left wrist pain after fall.

EXAM:
LEFT WRIST - COMPLETE 3+ VIEW

[x wrist pa left]
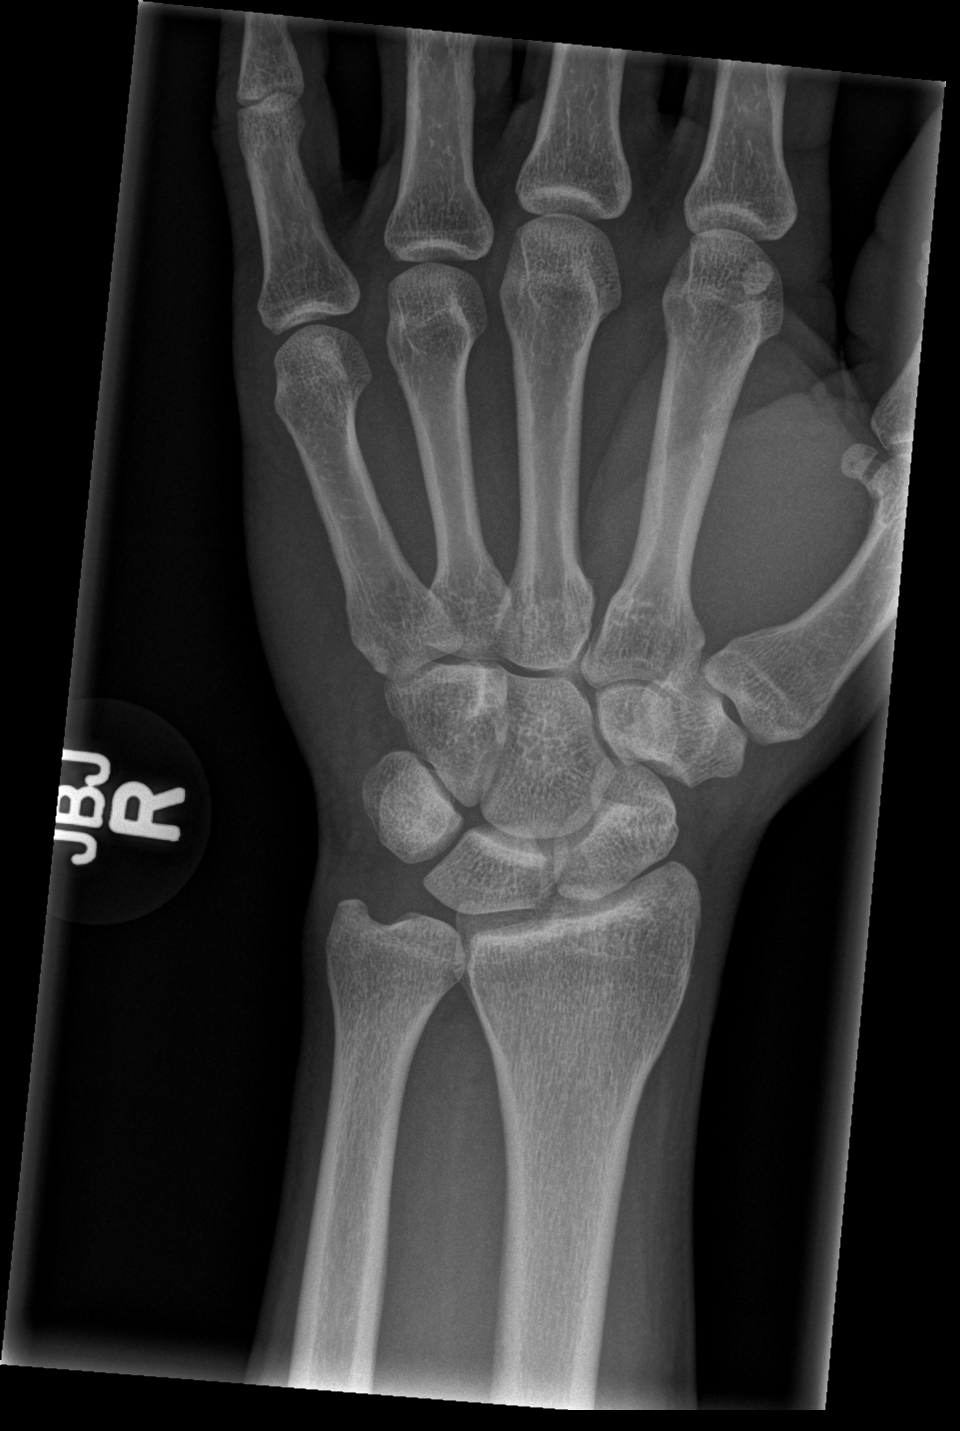

[x wrist obl left]
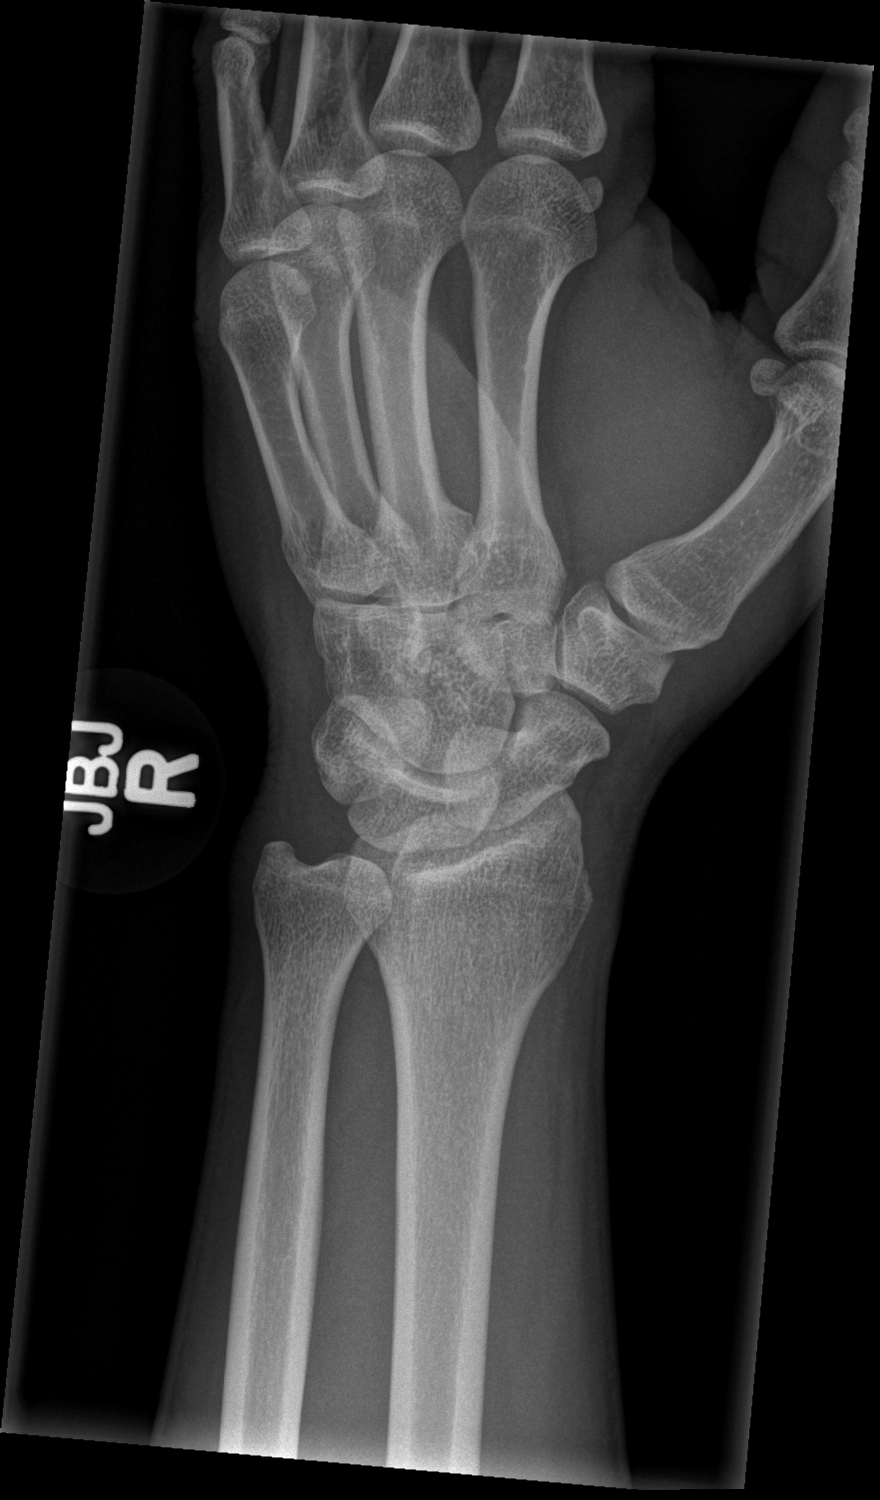

[x wrist lat left]
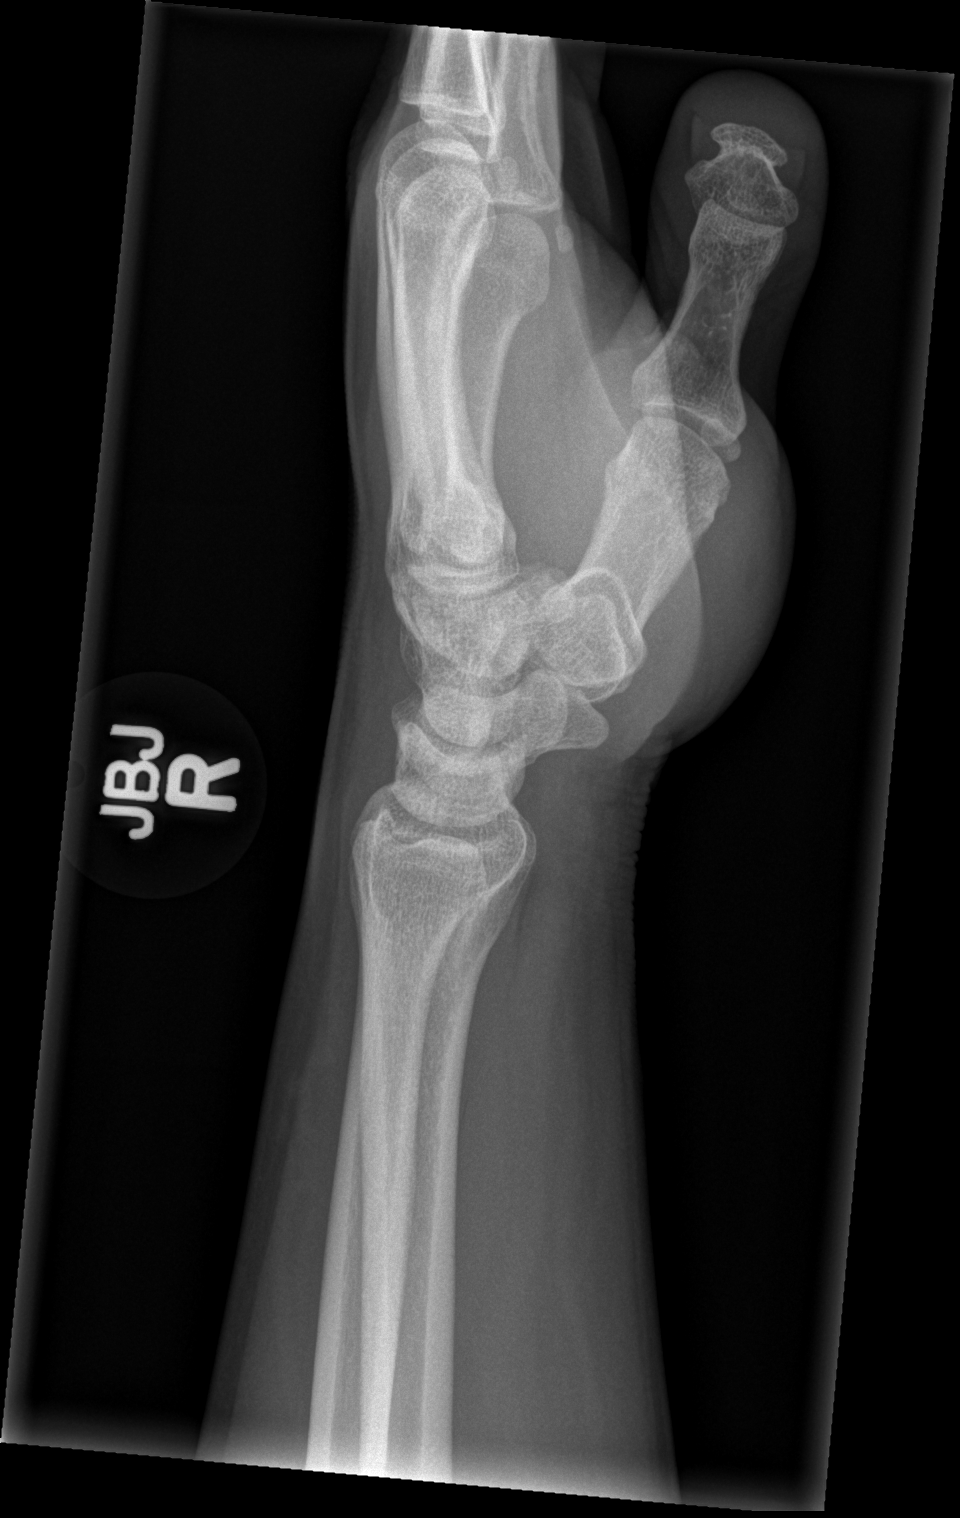

[x wrist navicular view left]
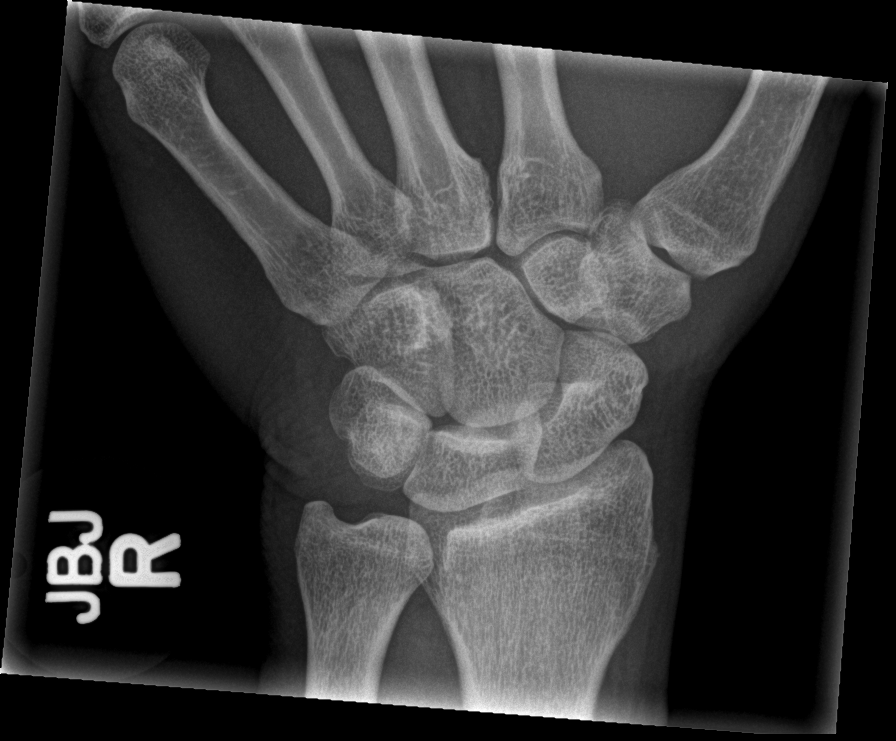

[4 of 4 positions shown; findings below may reference images not displayed]

FINDINGS: There is no evidence of fracture or dislocation. There is no
evidence of arthropathy or other focal bone abnormality. Soft
tissues are unremarkable.
IMPRESSION: Negative. If there is continued clinical concern for occult scaphoid
fracture, recommend follow up x-rays in 10-14 days.
